# Patient Record
Sex: Male | Born: 1970
Health system: Southern US, Community
[De-identification: ages and names within clinical notes are randomized; demographics above are authoritative.]

## PROBLEM LIST (undated history)

## (undated) DIAGNOSIS — K5900 Constipation, unspecified: Secondary | ICD-10-CM

## (undated) DIAGNOSIS — K219 Gastro-esophageal reflux disease without esophagitis: Secondary | ICD-10-CM

## (undated) DIAGNOSIS — E119 Type 2 diabetes mellitus without complications: Secondary | ICD-10-CM

## (undated) DIAGNOSIS — K449 Diaphragmatic hernia without obstruction or gangrene: Secondary | ICD-10-CM

## (undated) HISTORY — PX: NO PAST SURGERIES: SHX2092

## (undated) HISTORY — DX: Diaphragmatic hernia without obstruction or gangrene: K44.9

## (undated) HISTORY — DX: Gastro-esophageal reflux disease without esophagitis: K21.9

## (undated) HISTORY — DX: Constipation, unspecified: K59.00

---

## 2016-03-07 ENCOUNTER — Ambulatory Visit: Payer: Self-pay | Admitting: Family Medicine

## 2016-10-19 DIAGNOSIS — J42 Unspecified chronic bronchitis: Secondary | ICD-10-CM | POA: Insufficient documentation

## 2016-10-19 DIAGNOSIS — E349 Endocrine disorder, unspecified: Secondary | ICD-10-CM | POA: Insufficient documentation

## 2016-11-29 ENCOUNTER — Emergency Department
Admission: EM | Admit: 2016-11-29 | Discharge: 2016-11-29 | Disposition: A | Discharge status: Home / Self Care | Payer: Self-pay | Attending: Student in an Organized Health Care Education/Training Program | Admitting: Student in an Organized Health Care Education/Training Program

## 2016-11-29 ENCOUNTER — Encounter: Payer: Self-pay | Admitting: Medical Oncology

## 2016-11-29 ENCOUNTER — Emergency Department: Payer: Self-pay

## 2016-11-29 DIAGNOSIS — K611 Rectal abscess: Secondary | ICD-10-CM | POA: Insufficient documentation

## 2016-11-29 DIAGNOSIS — E119 Type 2 diabetes mellitus without complications: Secondary | ICD-10-CM | POA: Insufficient documentation

## 2016-11-29 DIAGNOSIS — K610 Anal abscess: Secondary | ICD-10-CM | POA: Insufficient documentation

## 2016-11-29 HISTORY — DX: Type 2 diabetes mellitus without complications: E11.9

## 2016-11-29 LAB — CBC
HCT: 43.1 % (ref 40.0–52.0)
HEMOGLOBIN: 14.8 g/dL (ref 13.0–18.0)
MCH: 30.7 pg (ref 26.0–34.0)
MCHC: 34.4 g/dL (ref 32.0–36.0)
MCV: 89.4 fL (ref 80.0–100.0)
Platelets: 159 10*3/uL (ref 150–440)
RBC: 4.82 MIL/uL (ref 4.40–5.90)
RDW: 12.9 % (ref 11.5–14.5)
WBC: 14.3 10*3/uL — AB (ref 3.8–10.6)

## 2016-11-29 LAB — COMPREHENSIVE METABOLIC PANEL
ALK PHOS: 68 U/L (ref 38–126)
ALT: 27 U/L (ref 17–63)
AST: 20 U/L (ref 15–41)
Albumin: 4.1 g/dL (ref 3.5–5.0)
Anion gap: 7 (ref 5–15)
BUN: 14 mg/dL (ref 6–20)
CALCIUM: 8.9 mg/dL (ref 8.9–10.3)
CHLORIDE: 100 mmol/L — AB (ref 101–111)
CO2: 27 mmol/L (ref 22–32)
CREATININE: 0.85 mg/dL (ref 0.61–1.24)
GFR calc non Af Amer: >60 mL/min (ref >60)
Glucose, Bld: 128 mg/dL — ABNORMAL HIGH (ref 65–99)
Potassium: 3.9 mmol/L (ref 3.5–5.1)
SODIUM: 134 mmol/L — AB (ref 135–145)
Total Bilirubin: 0.7 mg/dL (ref 0.3–1.2)
Total Protein: 7.6 g/dL (ref 6.5–8.1)

## 2016-11-29 MED ORDER — AMOXICILLIN-POT CLAVULANATE 875-125 MG PO TABS: 1.0000 | ORAL_TABLET | Freq: Two times a day (BID) | ORAL | 0 refills | Status: AC

## 2016-11-29 MED ORDER — LIDOCAINE HCL (PF) 1 % IJ SOLN
INTRAMUSCULAR | Status: AC
  Filled 2016-11-29: qty 10

## 2016-11-29 MED ORDER — OXYCODONE-ACETAMINOPHEN 5-325 MG PO TABS
1.0000 | ORAL_TABLET | Freq: Once | ORAL | Status: AC
  Administered 2016-11-29: 1 via ORAL
  Filled 2016-11-29: qty 1

## 2016-11-29 MED ORDER — IOPAMIDOL (ISOVUE-300) INJECTION 61%
100.0000 mL | Freq: Once | INTRAVENOUS | Status: AC | PRN
  Administered 2016-11-29: 100 mL via INTRAVENOUS
  Filled 2016-11-29: qty 100

## 2016-11-29 NOTE — ED Provider Notes (Signed)
[facility] Emergency Department Provider Note  ____________________________________________  Time seen: Approximately 4:48 PM  I have reviewed the triage vital signs and the nursing notes.   HISTORY  Chief Complaint Abscess    HPI [facility]. is a 45 y.o. male that presents to emergency department with an abscess on left buttocks for 3 days. Patient denies any drainage. Patient has been unable to sit today. Patient has not taken anything for pain today. Patient states that he has been having chills the last 2 days but he is unsure of fever. Patient states that he has had several abscesses in the past. Patient usually gets them on his stomach. Patient had abscess on left buttocks about one month ago but it spontaneously popped drained and healed.   Past Medical History:  Diagnosis Date  . Diabetes mellitus without complication ([place])     There are no active problems to display for this patient.   No past surgical history on file.  Prior to Admission medications   Medication Sig Start Date End Date Taking? Authorizing Provider  amoxicillin-clavulanate (AUGMENTIN) 875-125 MG tablet Take 1 tablet by mouth 2 (two) times daily. 11/29/16 12/09/16  Laban Emperor, PA-C    Allergies Patient has no known allergies.  No family history on file.  Social History Social History  Substance Use Topics  . Smoking status: Not on file  . Smokeless tobacco: Not on file  . Alcohol use Not on file     Review of Systems  ENT: No upper respiratory complaints. Cardiovascular: No chest pain. Respiratory: No cough. No SOB. Gastrointestinal: No abdominal pain.  No nausea, no vomiting.    ____________________________________________   PHYSICAL EXAM:  VITAL SIGNS: ED Triage Vitals  Enc Vitals Group     BP 11/29/16 1444 136/84     Pulse Rate 11/29/16 1444 (!) 102     Resp 11/29/16 1444 16     Temp 11/29/16 1444 98.2 F (36.8 C)     Temp Source 11/29/16  1444 Oral     SpO2 11/29/16 1444 97 %     Weight 11/29/16 1441 224 lb (101.6 kg)     Height 11/29/16 1441 5\' 9"  (1.753 m)     Head Circumference --      Peak Flow --      Pain Score 11/29/16 1441 4     Pain Loc --      Pain Edu? --      Excl. in [place]? --      Constitutional: Alert and oriented. Well appearing and in no acute distress. Eyes: Conjunctivae are normal. PERRL. EOMI. Head: Atraumatic. ENT:      Ears:      Nose: No congestion/rhinnorhea.      Mouth/Throat: Mucous membranes are moist.  Neck: No stridor.   Cardiovascular: Normal rate, regular rhythm. Normal S1 and S2.  Good peripheral circulation. Respiratory: Normal respiratory effort without tachypnea or retractions. Lungs CTAB. Good air entry to the bases with no decreased or absent breath sounds. Gastrointestinal: Bowel sounds 4 quadrants. Soft and nontender to palpation. No guarding or rigidity. No palpable masses. No distention.  Genitourinary: 4 cm fluctuant abscess on left buttock's near rectum. Redness extending around abscess. Musculoskeletal: Full range of motion to all extremities. No gross deformities appreciated. Neurologic:  Normal speech and language. No gross focal neurologic deficits are appreciated.  Skin:  Skin is warm, dry and intact. No rash noted. Psychiatric: Mood and affect are normal. Speech and behavior are  normal. Patient exhibits appropriate insight and judgement.   ____________________________________________   LABS (all labs ordered are listed, but only abnormal results are displayed)  Labs Reviewed  CBC - Abnormal; Notable for the following:       Result Value   WBC 14.3 (*)    All other components within normal limits  COMPREHENSIVE METABOLIC PANEL - Abnormal; Notable for the following:    Sodium 134 (*)    Chloride 100 (*)    Glucose, Bld 128 (*)    All other components within normal limits    ____________________________________________  EKG   ____________________________________________  RADIOLOGY  Ct Pelvis W Contrast  Result Date: 11/29/2016 CLINICAL DATA:  45 year old diabetic male with abscess near rectum for 3 days. Patient reports frequent abscesses. Denies fever. EXAM: CT PELVIS WITH CONTRAST TECHNIQUE: Multidetector CT imaging of the pelvis was performed using the standard protocol following the bolus administration of intravenous contrast. CONTRAST:  153mL ISOVUE-300 IOPAMIDOL (ISOVUE-300) INJECTION 61% COMPARISON:  None. FINDINGS: Adjacent to the inferior aspect of the rectum slightly to left of midline is a 4.7 x 4 x 2 cm abscess. There is mild inflammation of adjacent fat planes of the buttock region greater on the left which may indicate cellulitis/deep dependent edema. No associated gas collection as can be seen with necrotizing fasciitis. No extension into the pelvis. Mild infiltration of fat planes lower right anterior abdominal wall may be related to insulin injection although focal cellulitis at this level not excluded. Calcified plaque proximal right common iliac artery. Minimally lobulated prostate gland. Tiny amount of fluid within the scrotum. IMPRESSION: Adjacent to the inferior aspect of the rectum slightly to left of midline is a 4.7 x 4 x 2 cm abscess. There is mild inflammation of adjacent fat planes of the buttock region greater on the left which may indicate cellulitis/deep dependent edema. No associated gas collection as can be seen with necrotizing fasciitis. No extension into the pelvis. Mild infiltration of fat planes lower right anterior abdominal wall may be related to insulin injection although focal cellulitis at this level not excluded. Electronically Signed   By: Genia Del M.D.   On: 11/29/2016 17:18    ____________________________________________    PROCEDURES  Procedure(s) performed:    Procedures  INCISION AND  DRAINAGE Performed by: Laban Emperor Consent: Verbal consent obtained. Risks and benefits: risks, benefits and alternatives were discussed Type: abscess  Body area: left buttocks  Anesthesia: local infiltration  Incision was made with a scalpel.  Local anesthetic: lidocaine 1% without epinephrine  Anesthetic total: 4 ml  Complexity: complex Blunt dissection to break up loculations  Drainage: purulent  Drainage amount: 5ccs  Packing material: 1/4 in iodoform gauze  Patient tolerance: Patient tolerated the procedure well with no immediate complications.     Medications  lidocaine (PF) (XYLOCAINE) 1 % injection (not administered)  oxyCODONE-acetaminophen (PERCOCET/ROXICET) 5-325 MG per tablet 1 tablet (1 tablet Oral Given 11/29/16 1652)  iopamidol (ISOVUE-300) 61 % injection 100 mL (100 mLs Intravenous Contrast Given 11/29/16 1658)     ____________________________________________   INITIAL IMPRESSION / ASSESSMENT AND PLAN / ED COURSE  Pertinent labs & imaging results that were available during my care of the patient were reviewed by me and considered in my medical decision making (see chart for details).  Review of the  [place]RS was performed in accordance of the [place] prior to dispensing any controlled drugs.  Clinical Course     Patient's diagnosis is consistent with perianal abscess. Abscess was I&D  in the emergency department. Abscess does not extend into the rectum. The signs and exam are reassuring. Patient will be discharged home with prescriptions for Augmentin. Patient is to follow up with PCP in 2 days for wound recheck and packing removal. Patient is given ED precautions to return to the ED for any worsening or new symptoms.  ____________________________________________  FINAL CLINICAL IMPRESSION(S) / ED DIAGNOSES  Final diagnoses:  Perirectal abscess  Perianal abscess      NEW MEDICATIONS STARTED DURING THIS VISIT:  Discharge Medication List as  of 11/29/2016  6:34 PM    START taking these medications   Details  amoxicillin-clavulanate (AUGMENTIN) 875-125 MG tablet Take 1 tablet by mouth 2 (two) times daily., Starting Tue 11/29/2016, Until Fri 12/09/2016, Print            This chart was dictated using voice recognition software/Dragon. Despite best efforts to proofread, errors can occur which can change the meaning. Any change was purely unintentional.    Laban Emperor, PA-C 11/29/16 2136    Merlyn Lot, MD 11/29/16 2211

## 2016-11-29 NOTE — ED Triage Notes (Signed)
Abscess between buttocks x 3 days.

## 2016-11-29 NOTE — ED Notes (Signed)
Pt has an abscess on his rectum, pt reports have frequent abscesses,pt denies fever

## 2017-01-09 DIAGNOSIS — E119 Type 2 diabetes mellitus without complications: Secondary | ICD-10-CM | POA: Diagnosis not present

## 2017-01-09 DIAGNOSIS — E349 Endocrine disorder, unspecified: Secondary | ICD-10-CM | POA: Diagnosis not present

## 2017-01-16 DIAGNOSIS — E349 Endocrine disorder, unspecified: Secondary | ICD-10-CM | POA: Diagnosis not present

## 2017-01-16 DIAGNOSIS — J42 Unspecified chronic bronchitis: Secondary | ICD-10-CM | POA: Diagnosis not present

## 2017-01-16 DIAGNOSIS — E119 Type 2 diabetes mellitus without complications: Secondary | ICD-10-CM | POA: Diagnosis not present

## 2017-01-16 DIAGNOSIS — E7800 Pure hypercholesterolemia, unspecified: Secondary | ICD-10-CM | POA: Diagnosis not present

## 2017-01-30 DIAGNOSIS — R739 Hyperglycemia, unspecified: Secondary | ICD-10-CM | POA: Diagnosis not present

## 2017-01-30 DIAGNOSIS — R3911 Hesitancy of micturition: Secondary | ICD-10-CM | POA: Diagnosis not present

## 2017-05-04 DIAGNOSIS — E7800 Pure hypercholesterolemia, unspecified: Secondary | ICD-10-CM | POA: Diagnosis not present

## 2017-05-04 DIAGNOSIS — E119 Type 2 diabetes mellitus without complications: Secondary | ICD-10-CM | POA: Diagnosis not present

## 2017-05-04 DIAGNOSIS — J42 Unspecified chronic bronchitis: Secondary | ICD-10-CM | POA: Diagnosis not present

## 2017-05-04 LAB — HEMOGLOBIN A1C: Hemoglobin A1C: 8.3

## 2017-05-04 LAB — LIPID PANEL
Cholesterol: 154 (ref 0–200)
HDL: 29 — AB (ref 35–70)
LDL Cholesterol: 69
TRIGLYCERIDES: 282 — AB (ref 40–160)

## 2017-05-15 DIAGNOSIS — E119 Type 2 diabetes mellitus without complications: Secondary | ICD-10-CM | POA: Diagnosis not present

## 2017-05-15 DIAGNOSIS — K210 Gastro-esophageal reflux disease with esophagitis: Secondary | ICD-10-CM | POA: Diagnosis not present

## 2017-05-15 DIAGNOSIS — J42 Unspecified chronic bronchitis: Secondary | ICD-10-CM | POA: Diagnosis not present

## 2017-05-15 DIAGNOSIS — E7800 Pure hypercholesterolemia, unspecified: Secondary | ICD-10-CM | POA: Diagnosis not present

## 2017-08-30 ENCOUNTER — Encounter: Payer: Self-pay | Admitting: Family Medicine

## 2017-08-30 ENCOUNTER — Ambulatory Visit (INDEPENDENT_AMBULATORY_CARE_PROVIDER_SITE_OTHER): Payer: BLUE CROSS/BLUE SHIELD | Admitting: Family Medicine

## 2017-08-30 VITALS — BP 110/84 | HR 88 | Temp 98.2°F | Resp 16 | Ht 69.0 in | Wt 211.0 lb

## 2017-08-30 DIAGNOSIS — K449 Diaphragmatic hernia without obstruction or gangrene: Secondary | ICD-10-CM | POA: Insufficient documentation

## 2017-08-30 DIAGNOSIS — F329 Major depressive disorder, single episode, unspecified: Secondary | ICD-10-CM | POA: Insufficient documentation

## 2017-08-30 DIAGNOSIS — R34 Anuria and oliguria: Secondary | ICD-10-CM | POA: Diagnosis not present

## 2017-08-30 DIAGNOSIS — F331 Major depressive disorder, recurrent, moderate: Secondary | ICD-10-CM

## 2017-08-30 DIAGNOSIS — F32A Depression, unspecified: Secondary | ICD-10-CM | POA: Insufficient documentation

## 2017-08-30 DIAGNOSIS — Z7689 Persons encountering health services in other specified circumstances: Secondary | ICD-10-CM | POA: Diagnosis not present

## 2017-08-30 DIAGNOSIS — K219 Gastro-esophageal reflux disease without esophagitis: Secondary | ICD-10-CM | POA: Diagnosis not present

## 2017-08-30 DIAGNOSIS — G5603 Carpal tunnel syndrome, bilateral upper limbs: Secondary | ICD-10-CM

## 2017-08-30 DIAGNOSIS — E119 Type 2 diabetes mellitus without complications: Secondary | ICD-10-CM | POA: Diagnosis not present

## 2017-08-30 DIAGNOSIS — K5900 Constipation, unspecified: Secondary | ICD-10-CM | POA: Diagnosis not present

## 2017-08-30 MED ORDER — CITALOPRAM HYDROBROMIDE 20 MG PO TABS: 20.0000 mg | ORAL_TABLET | Freq: Every day | ORAL | 3 refills | Status: DC

## 2017-08-30 MED ORDER — DOCUSATE SODIUM 100 MG PO CAPS: 100.0000 mg | ORAL_CAPSULE | Freq: Two times a day (BID) | ORAL | 3 refills | Status: DC

## 2017-08-30 MED ORDER — POLYETHYLENE GLYCOL 3350 17 GM/SCOOP PO POWD: 17.0000 g | Freq: Two times a day (BID) | ORAL | 3 refills | Status: AC

## 2017-08-30 NOTE — Assessment & Plan Note (Signed)
Patient reports this was found on previous EGD and was told no treatment was necessary Likely contributing to GERD symptoms

## 2017-08-30 NOTE — Assessment & Plan Note (Signed)
Advised to wear cockup wrist splints bilaterally when sleeping daily at bedtime If symptoms continue, could try gabapentin Could also consider referral to orthopedics for injections

## 2017-08-30 NOTE — Assessment & Plan Note (Signed)
Patient needs a follow-up appointment for diabetes follow-up with foot exam, eye exam, vaccination updates, A1c

## 2017-08-30 NOTE — Assessment & Plan Note (Signed)
Patient reports decreased urinary frequency and some hesitancy He also has concerns about possible low testosterone Referral to urology for further management

## 2017-08-30 NOTE — Assessment & Plan Note (Signed)
Moderate depression with symptoms like anhedonia fatigue No SI Patient agrees to start SSRI Start Celexa 20 mg daily Discussed potential side effects including GI upset, sexual dysfunction, increased suicidality Contracted for safety Discussed that SSRIs take 6-8 weeks to reach maximum efficacy Follow-up in one month and consider dose titration

## 2017-08-30 NOTE — Progress Notes (Signed)
Patient: [name]. Male    DOB: [birth year]   46 y.o.   MRN: [id number] Visit Date: 08/30/2017  Today's Provider: Lavon Paganini, MD   Chief Complaint  Patient presents with  . Establish Care   Subjective:    HPI   Establish Care Ethan Fox presents to establish care. He was previously seeing [facility] as his primary, but his wife wanted him to change PCP's. He has a H/O GERD and elevated triglycerides. Last lipid panel was checked in May by [facility] He has DM; last A1C was 8.3% on 05/04/2017, and urine microalbumin was 11 at that time.   Reports circulation problems: States that arms seem to fall asleep and loses feeling in thumbs often.  Can happen out of the blue, often when laying down.  Worse when driving.  GF also mentions his color changes while he is sleeping. He turns "white".  Decreased urination: doesn't feel like he completely empties bladder, urine seems dark in color.  Drinking a lot of water and powerade. Only urinates 2-3 times daily.  Also reports chronic testicular pain that he thinks is due to old age.  Constipation:  Ongoing problem for several (10-15) years.  Seems to be worsening.  Was previously taking fiber supplement that was helping but stopped after starting statin.  Was previously taking lactulose and uses laxative (ex-lax) prn.  Can go up to 14 days without BM.  Often q4-5 days for BM.  Difficult to use bathroom.  Stays "gassy." DEcreased appetite due to bloating and fullness.  Previously seen by GI and received EGD and colonoscopy.    Doesn't think there were abnormalities (was seen by Dr Oletta Lamas in [place]).  Tried Miralax daily and didn't think it helped.  Lifestyle is changing due to only driving locally.    Decreased energy: very irritable.  Thinks he has low T and used to get testosterone shots at a young age.  Taking mushroom pills for energy supplement.  +depressed mood, but he doesn't think this is bad.  Quick to anger.  +inhadonia    No Known Allergies   Current Outpatient Prescriptions:  .  metFORMIN (GLUCOPHAGE-XR) 500 MG 24 hr tablet, Take 1 tablet by mouth 2 (two) times daily., Disp: , Rfl:  .  omeprazole (PRILOSEC) 20 MG capsule, Take 20 mg by mouth daily., Disp: , Rfl:  .  OVER THE COUNTER MEDICATION, Mushrooms Cordyceps Energy Support, Disp: , Rfl:  .  rosuvastatin (CRESTOR) 40 MG tablet, Take 1 tablet by mouth daily., Disp: , Rfl:   Review of Systems  Constitutional: Positive for activity change, appetite change, diaphoresis and fatigue.  HENT: Positive for congestion and facial swelling.   Eyes: Positive for photophobia, redness, itching and visual disturbance.  Respiratory: Positive for cough, choking, chest tightness, shortness of breath and stridor.   Cardiovascular: Positive for chest pain and palpitations.  Gastrointestinal: Positive for abdominal distention, constipation, nausea and vomiting.  Endocrine: Positive for cold intolerance and polydipsia.  Genitourinary: Positive for decreased urine volume, difficulty urinating and genital sores.  Musculoskeletal: Positive for back pain, myalgias and neck pain.  Skin: Positive for color change and pallor.  Neurological: Positive for dizziness, light-headedness, numbness and headaches.  Hematological: Bruises/bleeds easily.  Psychiatric/Behavioral: Positive for agitation, confusion and decreased concentration.  All other systems reviewed and are negative.  Past Medical History:  Diagnosis Date  . Constipation   . Diabetes mellitus without complication (Ethan Fox)   . GERD (gastroesophageal reflux disease)   .  Hiatal hernia    Past Surgical History:  Procedure Laterality Date  . NO PAST SURGERIES     Family History  Problem Relation Age of Onset  . COPD Mother 40       smoker  . Kidney disease Mother   . Diabetes Mother   . Pancreatic cancer Mother   . Diabetes Father   . Hypothyroidism Sister   . Heart disease Maternal Grandmother     . Heart disease Maternal Grandfather   . Heart disease Paternal Grandmother 77       died of MI  . Colon cancer Neg Hx   . Breast cancer Neg Hx   . Prostate cancer Neg Hx     Social History  Substance Use Topics  . Smoking status: Not on file  . Smokeless tobacco: Not on file  . Alcohol use Not on file   Objective:   BP 110/84 (BP Location: Right Arm, Patient Position: Sitting, Cuff Size: Large)   Pulse 88   Temp 98.2 F (36.8 C) (Oral)   Resp 16   Ht 5\' 9"  (1.753 m)   Wt 211 lb (95.7 kg)   BMI 31.16 kg/m  Vitals:   08/30/17 1525  BP: 110/84  Pulse: 88  Resp: 16  Temp: 98.2 F (36.8 C)  TempSrc: Oral  Weight: 211 lb (95.7 kg)  Height: 5\' 9"  (1.753 m)     Physical Exam  Constitutional: He is oriented to person, place, and time. He appears well-developed and well-nourished. No distress.  HENT:  Head: Normocephalic and atraumatic.  Right Ear: External ear normal.  Left Ear: External ear normal.  Mouth/Throat: Oropharynx is clear and moist.  Eyes: Pupils are equal, round, and reactive to light. Conjunctivae are normal. No scleral icterus.  Neck: Neck supple. No thyromegaly present.  Cardiovascular: Normal rate, regular rhythm, normal heart sounds and intact distal pulses.   No murmur heard. Pulmonary/Chest: Effort normal and breath sounds normal. No respiratory distress. He has no wheezes. He has no rales.  Abdominal: Soft. Bowel sounds are normal. He exhibits no distension. There is no tenderness. There is no rebound and no guarding.  Musculoskeletal: He exhibits no edema or deformity.  Negative Tinel's, but positive Phalen's testing bilaterally  Lymphadenopathy:    He has no cervical adenopathy.  Neurological: He is alert and oriented to person, place, and time.  Skin: Skin is warm and dry. No rash noted.  Psychiatric: He has a normal mood and affect. His behavior is normal.  Vitals reviewed.   Depression screen PHQ 2/9 08/30/2017  Decreased Interest 3   Down, Depressed, Hopeless 3  PHQ - 2 Score 6  Altered sleeping 3  Tired, decreased energy 3  Change in appetite 1  Feeling bad or failure about yourself  0  Trouble concentrating 0  Moving slowly or fidgety/restless 0  Suicidal thoughts 0  PHQ-9 Score 13  Difficult doing work/chores Not difficult at all       Assessment & Plan:      Problem List Items Addressed This Visit      Respiratory   Hiatal hernia    Patient reports this was found on previous EGD and was told no treatment was necessary Likely contributing to GERD symptoms        Digestive   GERD (gastroesophageal reflux disease)    Continue PPI We will obtain records from previous EGD May want to consider tapering PPI in the future given potential consequences of long-term PPI use  Relevant Medications   omeprazole (PRILOSEC) 20 MG capsule   polyethylene glycol powder (GLYCOLAX/MIRALAX) powder   docusate sodium (COLACE) 100 MG capsule   Constipation    Significant 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, eating poorly, lack of exercise Likely that: Is dilated from chronic constipation and thus has not rebounded quickly with change of lifestyle We will start Mira lax and Colace twice daily Follow-up in one month and consider titration We will attempt to obtain records from previous colonoscopy        Endocrine   Diabetes mellitus without complication [facility])    Patient needs a follow-up appointment for diabetes follow-up with foot exam, eye exam, vaccination updates, A1c      Relevant Medications   rosuvastatin (CRESTOR) 40 MG tablet   metFORMIN (GLUCOPHAGE-XR) 500 MG 24 hr tablet   aspirin 81 MG chewable tablet     Nervous and Auditory   Bilateral carpal tunnel syndrome    Advised to wear cockup wrist splints bilaterally when sleeping daily at bedtime If symptoms continue, could try gabapentin Could also consider referral to orthopedics for injections      Relevant  Medications   citalopram (CELEXA) 20 MG tablet     Other   Depression    Moderate depression with symptoms like anhedonia fatigue No SI Patient agrees to start SSRI Start Celexa 20 mg daily Discussed potential side effects including GI upset, sexual dysfunction, increased suicidality Contracted for safety Discussed that SSRIs take 6-8 weeks to reach maximum efficacy Follow-up in one month and consider dose titration      Relevant Medications   citalopram (CELEXA) 20 MG tablet   Decreased urination    Patient reports decreased urinary frequency and some hesitancy He also has concerns about possible low testosterone Referral to urology for further management      Relevant Orders   Ambulatory referral to Urology    Other Visit Diagnoses    Encounter to establish care    -  Primary          The entirety of the information documented in the History of Present Illness, Review of Systems and Physical Exam were personally obtained by me. Portions of this information were initially documented by Raquel Sarna Ratchford, CMA and reviewed by me for thoroughness and accuracy.     Lavon Paganini, MD  [facility]

## 2017-08-30 NOTE — Assessment & Plan Note (Signed)
Continue PPI We will obtain records from previous EGD May want to consider tapering PPI in the future given potential consequences of long-term PPI use

## 2017-08-30 NOTE — Patient Instructions (Signed)
Switch to daily baby aspirin (81mg ).  Start celexa for depression symptoms (decreased energy, etc).  Try miralax and colace twice daily for constipation.  I will refer to urology for urinary complaints. You can also discuss concerns regarding low-T with them.   Fiber Content in Foods See the following list for the dietary fiber content of some common foods. High-fiber foods High-fiber foods contain 4 grams or more (4g or more) of fiber per serving. They include:  Artichoke (fresh) - 1 medium has 10.3g of fiber.  Baked beans, plain or vegetarian (canned) -  cup has 5.2g of fiber.  Blackberries or raspberries (fresh) -  cup has 4g of fiber.  Bran cereal -  cup has 8.6g of fiber.  Bulgur (cooked) -  cup has 4g of fiber.  Kidney beans (canned) -  cup has 6.8g of fiber.  Lentils (cooked) -  cup has 7.8g of fiber.  Pear (fresh) - 1 medium has 5.1g of fiber.  Peas (frozen) -  cup has 4.4g of fiber.  Pinto beans (canned) -  cup has 5.5g of fiber.  Pinto beans (dried and cooked) -  cup has 7.7g of fiber.  Potato with skin (baked) - 1 medium has 4.4g of fiber.  Quinoa (cooked) -  cup has 5g of fiber.  Soybeans (canned, frozen, or fresh) -  cup has 5.1g of fiber.  Moderate-fiber foods Moderate-fiber foods contain 1-4 grams (1-4g) of fiber per serving. They include:  Almonds - 1 oz. has 3.5g of fiber.  Apple with skin - 1 medium has 3.3g of fiber.  Applesauce, sweetened -  cup has 1.5g of fiber.  Bagel, plain - one 4-inch (10-cm) bagel has 2g of fiber.  Banana - 1 medium has 3.1g of fiber.  Broccoli (cooked) -  cup has 2.5g of fiber.  Carrots (cooked) -  cup has 2.3g of fiber.  Corn (canned or frozen) -  cup has 2.1g of fiber.  Corn tortilla - one 6-inch (15-cm) tortilla has 1.5g of fiber.  Green beans (canned) -  cup has 2g of fiber.  Instant oatmeal -  cup has about 2g of fiber.  Long-grain brown rice (cooked) - 1 cup has 3.5g of fiber.  Macaroni,  enriched (cooked) - 1 cup has 2.5g of fiber.  Melon - 1 cup has 1.4g of fiber.  Multigrain cereal -  cup has about 2-4g of fiber.  Orange - 1 small has 3.1g of fiber.  Potatoes, mashed -  cup has 1.6g of fiber.  Raisins - 1/4 cup has 1.6g of fiber.  Squash -  cup has 2.9g of fiber.  Sunflower seeds -  cup has 1.1g of fiber.  Tomato - 1 medium has 1.5g of fiber.  Vegetable or soy patty - 1 has 3.4g of fiber.  Whole-wheat bread - 1 slice has 2g of fiber.  Whole-wheat spaghetti -  cup has 3.2g of fiber.  Low-fiber foods Low-fiber foods contain less than 1 gram (less than 1g) of fiber per serving. They include:  Egg - 1 large.  Flour tortilla - one 6-inch (15-cm) tortilla.  Fruit juice -  cup.  Lettuce - 1 cup.  Meat, poultry, or fish - 1 oz.  Milk - 1 cup.  Spinach (raw) - 1 cup.  White bread - 1 slice.  White rice -  cup.  Yogurt -  cup.  Actual amounts of fiber in foods may be different depending on processing. Talk with your dietitian about how much fiber you need  in your diet. This information is not intended to replace advice given to you by your health care provider. Make sure you discuss any questions you have with your health care provider. Document Released: 04/09/2007 Document Revised: 04/28/2016 Document Reviewed: 01/14/2016 Elsevier Interactive Patient Education  2018 [employer].

## 2017-08-30 NOTE — Assessment & Plan Note (Signed)
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

## 2017-09-11 NOTE — Progress Notes (Signed)
09/12/2017 12:01 PM   [employer]. [birth year] [id number]  Referring provider: Virginia Crews, MD [address] [place], [place] [address]  Chief Complaint  Patient presents with  . New Patient (Initial Visit)    Decreased Urination / Left Testicle pain referred by Dr. Geri Seminole    HPI: Patient is a 46 year old Caucasian male with DM who is referred by Dr. Geri Seminole for decreased urination and a history of testosterone deficiency with his girlfriend, Sharyn Lull.    BPH WITH LUTS  (prostate and/or bladder) His IPSS score today is 17, which is moderate lower urinary tract symptomatology.  He is unhappy with his quality life due to his urinary symptoms.  His PVR is 251 mL.    His major complaint today is a weak urinary stream.  He has had these symptoms for two years.  He denies any dysuria, hematuria or suprapubic pain.   He also denies any recent fevers, chills, nausea or vomiting.  He does not have a family history of PCa.      IPSS    Row Name 09/12/17 1100         International Prostate Symptom Score   How often have you had the sensation of not emptying your bladder? About half the time     How often have you had to urinate less than every two hours? Less than 1 in 5 times     How often have you found you stopped and started again several times when you urinated? About half the time     How often have you found it difficult to postpone urination? More than half the time     How often have you had a weak urinary stream? Almost always     How often have you had to strain to start urination? Not at All     How many times did you typically get up at night to urinate? 1 Time     Total IPSS Score 17       Quality of Life due to urinary symptoms   If you were to spend the rest of your life with your urinary condition just the way it is now how would you feel about that? Unhappy        Score:  1-7 Mild 8-19 Moderate 20-35  Severe   Testosterone deficiency Patient is experiencing a decrease in libido, a lack of energy, a decrease in strength, a decreased enjoyment in life, sadness and/or grumpiness, erections being less strong and a recent deterioration in an ability to play sports.  This is indicated by his responses to the ADAM questionnaire.  He is still having spontaneous erections at night.  Girlfriend states that she feels she has witnessed apneic episodes while he was sleeping.  He has not had a formal sleep study.   He is reporting a loss of body hair, reduced beard growth, obesity, poor memory, type 2 diabetes mellitus.  He has been on testosterone cypionate in the past.       Androgen Deficiency in the Aging Male    [place] Name 09/12/17 1100         Androgen Deficiency in the Aging Male   Do you have a decrease in libido (sex drive) Yes     Do you have lack of energy Yes     Do you have a decrease in strength and/or endurance Yes     Have you lost height No  Have you noticed a decreased "enjoyment of life" Yes     Are you sad and/or grumpy Yes     Are your erections less strong Yes     Have you noticed a recent deterioration in your ability to play sports Yes     Are you falling asleep after dinner No     Has there been a recent deterioration in your work performance No       Erectile dysfunction His SHIM score is 19, which is mild ED.   He has been having difficulty with erections for a few years.   His major complaint is not having erections when he is wanting to have sex.  His libido is diminished.   His risk factors for ED are age, BPH, DM, HLD, sleep apnea, night shift work and smoking.   He denies any painful erections or curvatures with his erections.   He is still having spontaneous erections.       SHIM    Row Name 09/12/17 1124         SHIM: Over the last 6 months:   How do you rate your confidence that you could get and keep an erection? Moderate     When you had erections with  sexual stimulation, how often were your erections hard enough for penetration (entering your partner)? Sometimes (about half the time)     During sexual intercourse, how often were you able to maintain your erection after you had penetrated (entered) your partner? Most Times (much more than half the time)     During sexual intercourse, how difficult was it to maintain your erection to completion of intercourse? Not Difficult     When you attempted sexual intercourse, how often was it satisfactory for you? Most Times (much more than half the time)       SHIM Total Score   SHIM 19        Score: 1-7 Severe ED 8-11 Moderate ED 12-16 Mild-Moderate ED 17-21 Mild ED 22-25 No ED  Reviewed referral notes -  Patient reports decreased urinary frequency and some hesitancy He also has concerns about possible low testosterone Referral to urology for further management  PMH: Past Medical History:  Diagnosis Date  . Constipation   . Diabetes mellitus without complication ([place])   . GERD (gastroesophageal reflux disease)   . Hiatal hernia     Surgical History: Past Surgical History:  Procedure Laterality Date  . NO PAST SURGERIES      Home Medications:  Allergies as of 09/12/2017   No Known Allergies     Medication List       Accurate as of 09/12/17 12:01 PM. Always use your most recent med list.          aspirin 81 MG chewable tablet Chew 81 mg by mouth daily.   citalopram 20 MG tablet Commonly known as:  CELEXA Take 1 tablet (20 mg total) by mouth daily.   docusate sodium 100 MG capsule Commonly known as:  COLACE Take 1 capsule (100 mg total) by mouth 2 (two) times daily.   metFORMIN 500 MG 24 hr tablet Commonly known as:  GLUCOPHAGE-XR Take 1 tablet by mouth 2 (two) times daily.   omeprazole 20 MG capsule Commonly known as:  PRILOSEC Take 20 mg by mouth daily.   OVER THE COUNTER MEDICATION Mushrooms Cordyceps Energy Support   polyethylene glycol powder  powder Commonly known as:  GLYCOLAX/MIRALAX Take 17 g by mouth 2 (two) times  daily.   rosuvastatin 40 MG tablet Commonly known as:  CRESTOR Take 1 tablet by mouth daily.   tamsulosin 0.4 MG Caps capsule Commonly known as:  FLOMAX Take 1 capsule (0.4 mg total) by mouth daily.       Allergies: No Known Allergies  Family History: Family History  Problem Relation Age of Onset  . COPD Mother 53       smoker  . Kidney disease Mother   . Diabetes Mother   . Pancreatic cancer Mother   . Diabetes Father   . Hypothyroidism Sister   . Heart disease Maternal Grandmother   . Heart disease Maternal Grandfather   . Heart disease Paternal Grandmother 18       died of MI  . Colon cancer Neg Hx   . Breast cancer Neg Hx   . Prostate cancer Neg Hx   . Kidney cancer Neg Hx   . Bladder Cancer Neg Hx     Social History:  reports that he has been smoking Cigarettes.  He has a 15.00 pack-year smoking history. He has never used smokeless tobacco. He reports that he drinks alcohol. He reports that he does not use drugs.  ROS: UROLOGY Frequent Urination?: No Hard to postpone urination?: No Burning/pain with urination?: No Get up at night to urinate?: No Leakage of urine?: No Urine stream starts and stops?: No Trouble starting stream?: No Do you have to strain to urinate?: No Blood in urine?: No Urinary tract infection?: No Sexually transmitted disease?: No Injury to kidneys or bladder?: No Painful intercourse?: No Weak stream?: Yes Erection problems?: Yes Penile pain?: No  Gastrointestinal Nausea?: No Vomiting?: No Indigestion/heartburn?: Yes Diarrhea?: No Constipation?: Yes  Constitutional Fever: No Night sweats?: Yes Weight loss?: No Fatigue?: Yes  Skin Skin rash/lesions?: Yes Itching?: No  Eyes Blurred vision?: Yes Double vision?: Yes  Ears/Nose/Throat Sore throat?: No Sinus problems?: No  Hematologic/Lymphatic Swollen glands?: No Easy bruising?:  Yes  Cardiovascular Leg swelling?: No Chest pain?: Yes  Respiratory Cough?: Yes Shortness of breath?: Yes  Endocrine Excessive thirst?: No  Musculoskeletal Back pain?: Yes Joint pain?: Yes  Neurological Headaches?: Yes Dizziness?: Yes  Psychologic Depression?: Yes Anxiety?: Yes  Physical Exam: BP 127/81   Pulse 86   Ht 5\' 10"  (1.778 m)   Wt 205 lb 6.4 oz (93.2 kg)   BMI 29.47 kg/m   Constitutional: Well nourished. Alert and oriented, No acute distress. HEENT: [place] AT, moist mucus membranes. Trachea midline, no masses. Cardiovascular: No clubbing, cyanosis, or edema. Respiratory: Normal respiratory effort, no increased work of breathing. GI: Abdomen is soft, non tender, non distended, no abdominal masses. Liver and spleen not palpable.  No hernias appreciated.  Stool sample for occult testing is not indicated.   GU: No CVA tenderness.  No bladder fullness or masses.  Patient with circumcised phallus.   Urethral meatus is patent.  No penile discharge. No penile lesions or rashes. Scrotum without lesions, cysts, rashes and/or edema.  Testicles are located scrotally bilaterally. No masses are appreciated in the testicles. Left and right epididymis are normal. Rectal: Patient with  normal sphincter tone. Anus and perineum without scarring or rashes. No rectal masses are appreciated. Prostate is approximately 45 grams, no nodules are appreciated. Seminal vesicles are normal. Skin: No rashes, bruises or suspicious lesions. Lymph: No cervical or inguinal adenopathy. Neurologic: Grossly intact, no focal deficits, moving all 4 extremities. Psychiatric: Normal mood and affect.  Laboratory Data: Lab Results  Component Value Date  WBC 14.3 (H) 11/29/2016   HGB 14.8 11/29/2016   HCT 43.1 11/29/2016   MCV 89.4 11/29/2016   PLT 159 11/29/2016    Lab Results  Component Value Date   CREATININE 0.85 11/29/2016    Lab Results  Component Value Date   AST 20 11/29/2016   Lab  Results  Component Value Date   ALT 27 11/29/2016    I have reviewed the labs.   Pertinent Imaging: Results for SEVIN, LANGENBACH (MRN [id number]) as of 09/12/2017 12:11  Ref. Range 09/12/2017 11:22  Scan Result Unknown [id number]   I have independently reviewed the films.    Assessment & Plan:    1. BPH with LUTS  - IPSS score is 17/5  - Continue conservative management, avoiding bladder irritants and timed voiding's  - most bothersome symptoms is/are weak stream  - Initiate alpha-blocker (tamsulosin 0.4 mg), discussed side effects   - RTC in one month for I PSS and PVR   2. History of testosterone deficiency  - I explained to patient that the current guidelines from the [place] reports the diagnosis of hypogonadism requires a morning serum total testosterone level at least 2 days apart that is below 300 ng/dL - most insurances require the blood work to be done before 9 am.    - At this time, the patient does not meet this requirement.  He will return for two morning serum testosterones, two days apart before 9 AM   - If the testosterones levels return below 300 ng/dL we will need to do additional blood work [facility] - if that returns below normal or low normal we will need to add a prolactin) - if that blood work is abnormal - we will need to refer to endocrinology  -I discussed with the patient the side effects of testosterone therapy, such as: enlargement of the prostate gland that may in turn cause LUTS, possible increased risk of PCa, DVT's and/or PE's, possible increased risk of heart attack or stroke, lower sperm count, swelling of the ankles, feet, or body, with or without heart failure, enlarged or painful breasts, have problems breathing while you sleep (sleep apnea), increased prostate specific antigen, mood swings, hypertension and increased red blood cell count - we will need to check HCT/HBG levels prior to therapy and PSA if the patient is over 40 - he needs a sleep study prior to  starting any exogenous testosterone therapy if he meets criteria  - I also discussed that some men have had success using clomid for hypogonadism.  It does seem to be more successful in younger men, but there are incidences of good results in middle-aged men.  I explained that it is used in male infertility to stimulate the testicles to make more testosterone/sperm.  There has been no long term data on side effects, but some urologists has been having success with this medication.   3. Erectile dysfunction  - SHIM score is 19  - I explained to the patient that in order to achieve an erection it takes good functioning of the nervous system (parasympathetic and rs, sympathetic, sensory and motor), good blood flow into the erectile tissue of the penis and a desire to have sex  - I explained that conditions like diabetes, hypertension, coronary artery disease, peripheral vascular disease, smoking, alcohol consumption, age, sleep apnea and BPH can diminish the ability to have an erection  - I explained the ED may be a risk marker for underlying CVD and he should follow  up with PCP for further studies - has an appointment in a few weaks  - we will obtain a serum testosterone level at this time; if it is abnormal we will need to repeat the study for confirmation  - A recent study published in Sex Med 2018 Apr 13 revealed moderate to vigorous aerobic exercise for 40 minutes 4 times per week can decrease erectile problems caused by physical inactivity, obesity, hypertension, metabolic syndrome and/or cardiovascular diseases  - RTC pending testosterone results  4. Diabetes  - I explained to the patient how uncontrolled diabetes can contribute to a dysfunctional bladder, constipation and erectile dysfunction  - Encouraged the patient to remove his PCP to get better control of his diabetes at this time  Return in about 1 month (around 10/13/2017) for Wednesday before 9 AM testosterone, IPSS and PVR.  These  notes generated with voice recognition software. I apologize for typographical errors.  Zara Council, [facility] [address], [place], [place] [address] [phone]

## 2017-09-12 ENCOUNTER — Encounter: Payer: Self-pay | Admitting: Urology

## 2017-09-12 ENCOUNTER — Ambulatory Visit: Payer: BLUE CROSS/BLUE SHIELD | Admitting: Urology

## 2017-09-12 VITALS — BP 127/81 | HR 86 | Ht 70.0 in | Wt 205.4 lb

## 2017-09-12 DIAGNOSIS — N138 Other obstructive and reflux uropathy: Secondary | ICD-10-CM

## 2017-09-12 DIAGNOSIS — N529 Male erectile dysfunction, unspecified: Secondary | ICD-10-CM

## 2017-09-12 DIAGNOSIS — N401 Enlarged prostate with lower urinary tract symptoms: Secondary | ICD-10-CM | POA: Diagnosis not present

## 2017-09-12 DIAGNOSIS — E349 Endocrine disorder, unspecified: Secondary | ICD-10-CM | POA: Diagnosis not present

## 2017-09-12 LAB — BLADDER SCAN AMB NON-IMAGING: Scan Result: 251

## 2017-09-12 MED ORDER — TAMSULOSIN HCL 0.4 MG PO CAPS: 0.4000 mg | ORAL_CAPSULE | Freq: Every day | ORAL | 3 refills | Status: DC

## 2017-09-13 ENCOUNTER — Other Ambulatory Visit: Payer: BLUE CROSS/BLUE SHIELD

## 2017-09-13 ENCOUNTER — Other Ambulatory Visit: Payer: Self-pay

## 2017-09-13 DIAGNOSIS — R7989 Other specified abnormal findings of blood chemistry: Secondary | ICD-10-CM

## 2017-09-14 LAB — TESTOSTERONE: Testosterone: 292 ng/dL (ref 264–916)

## 2017-09-18 ENCOUNTER — Telehealth: Payer: Self-pay

## 2017-09-18 DIAGNOSIS — E291 Testicular hypofunction: Secondary | ICD-10-CM

## 2017-09-18 NOTE — Telephone Encounter (Signed)
-----   Message from Nori Riis, PA-C sent at 09/14/2017  7:40 AM EDT ----- Please let Ethan Fox know that his first testosterone level is under 300.  We will need to confirm, so we will need a second blood draw before 9 AM.

## 2017-09-18 NOTE — Telephone Encounter (Signed)
Spoke with pt in reference to lab results. Made aware will need 1 more lab draw before 9. Pt voiced understanding. Lab appt made and orders placed.

## 2017-09-19 ENCOUNTER — Other Ambulatory Visit: Payer: BLUE CROSS/BLUE SHIELD

## 2017-09-19 DIAGNOSIS — E291 Testicular hypofunction: Secondary | ICD-10-CM | POA: Diagnosis not present

## 2017-09-20 ENCOUNTER — Telehealth: Payer: Self-pay

## 2017-09-20 LAB — TESTOSTERONE: TESTOSTERONE: 276 ng/dL (ref 264–916)

## 2017-09-20 NOTE — Telephone Encounter (Signed)
Labs added at [facility].

## 2017-09-20 NOTE — Telephone Encounter (Signed)
-----   Message from Nori Riis, PA-C sent at 09/20/2017  7:42 AM EDT ----- Please add a LH and prolactin level to his blood work.

## 2017-09-21 ENCOUNTER — Encounter: Payer: Self-pay | Admitting: Family Medicine

## 2017-09-21 ENCOUNTER — Telehealth: Payer: Self-pay

## 2017-09-21 ENCOUNTER — Other Ambulatory Visit: Payer: Self-pay | Admitting: Family Medicine

## 2017-09-21 DIAGNOSIS — E781 Pure hyperglyceridemia: Secondary | ICD-10-CM

## 2017-09-21 DIAGNOSIS — E119 Type 2 diabetes mellitus without complications: Secondary | ICD-10-CM

## 2017-09-21 DIAGNOSIS — D72829 Elevated white blood cell count, unspecified: Secondary | ICD-10-CM | POA: Insufficient documentation

## 2017-09-21 NOTE — Telephone Encounter (Signed)
Pt's girlfriend is requesting a lab order for repeat lipid panel and A1C to be drawn prior to appointment on 09/28/2017. Per Dr. Meredeth Ide was advised that Dr. Jacinto Reap will look at chart and order other needed labs, and to return tomorrow for lab draw. Also, Sharyn Lull states pt is still having decreased urinary output.

## 2017-09-21 NOTE — Telephone Encounter (Signed)
Orders placed for CBC, CMP, A1c, lipid panel.  There is a chance that insurance may not cover lipid panel if checked more than every 12 months.  Virginia Crews, MD, MPH [facility] 09/21/2017 3:23 PM

## 2017-09-27 LAB — FSH/LH
FSH: 6 m[IU]/mL (ref 1.5–12.4)
LH: 7.2 m[IU]/mL (ref 1.7–8.6)

## 2017-09-27 LAB — SPECIMEN STATUS REPORT

## 2017-09-27 LAB — PROLACTIN: PROLACTIN: 16 ng/mL — AB (ref 4.0–15.2)

## 2017-09-28 ENCOUNTER — Encounter: Payer: Self-pay | Admitting: Family Medicine

## 2017-09-28 ENCOUNTER — Ambulatory Visit (INDEPENDENT_AMBULATORY_CARE_PROVIDER_SITE_OTHER): Payer: BLUE CROSS/BLUE SHIELD | Admitting: Family Medicine

## 2017-09-28 VITALS — BP 120/74 | HR 84 | Temp 98.0°F | Resp 16 | Wt 206.0 lb

## 2017-09-28 DIAGNOSIS — Z23 Encounter for immunization: Secondary | ICD-10-CM

## 2017-09-28 DIAGNOSIS — K5900 Constipation, unspecified: Secondary | ICD-10-CM

## 2017-09-28 DIAGNOSIS — Z720 Tobacco use: Secondary | ICD-10-CM | POA: Diagnosis not present

## 2017-09-28 DIAGNOSIS — F331 Major depressive disorder, recurrent, moderate: Secondary | ICD-10-CM

## 2017-09-28 DIAGNOSIS — S41102A Unspecified open wound of left upper arm, initial encounter: Secondary | ICD-10-CM | POA: Diagnosis not present

## 2017-09-28 DIAGNOSIS — E119 Type 2 diabetes mellitus without complications: Secondary | ICD-10-CM

## 2017-09-28 LAB — CBC WITH DIFFERENTIAL/PLATELET
BASOS ABS: 38 {cells}/uL (ref 0–200)
Basophils Relative: 0.5 %
EOS ABS: 98 {cells}/uL (ref 15–500)
Eosinophils Relative: 1.3 %
HEMATOCRIT: 44.7 % (ref 38.5–50.0)
HEMOGLOBIN: 15.6 g/dL (ref 13.2–17.1)
LYMPHS ABS: 1890 {cells}/uL (ref 850–3900)
MCH: 29.9 pg (ref 27.0–33.0)
MCHC: 34.9 g/dL (ref 32.0–36.0)
MCV: 85.6 fL (ref 80.0–100.0)
MPV: 11.6 fL (ref 7.5–12.5)
Monocytes Relative: 7.6 %
NEUTROS ABS: 4905 {cells}/uL (ref 1500–7800)
Neutrophils Relative %: 65.4 %
Platelets: 168 10*3/uL (ref 140–400)
RBC: 5.22 10*6/uL (ref 4.20–5.80)
RDW: 12.9 % (ref 11.0–15.0)
Total Lymphocyte: 25.2 %
WBC mixed population: 570 cells/uL (ref 200–950)
WBC: 7.5 10*3/uL (ref 3.8–10.8)

## 2017-09-28 LAB — COMPLETE METABOLIC PANEL WITH GFR
AG Ratio: 2 (calc) (ref 1.0–2.5)
ALBUMIN MSPROF: 4.5 g/dL (ref 3.6–5.1)
ALT: 27 U/L (ref 9–46)
AST: 15 U/L (ref 10–40)
Alkaline phosphatase (APISO): 66 U/L (ref 40–115)
BUN: 13 mg/dL (ref 7–25)
CALCIUM: 9.2 mg/dL (ref 8.6–10.3)
CO2: 28 mmol/L (ref 20–32)
CREATININE: 0.84 mg/dL (ref 0.60–1.35)
Chloride: 104 mmol/L (ref 98–110)
GFR, EST NON AFRICAN AMERICAN: 106 mL/min/{1.73_m2} (ref >=60)
GFR, Est African American: 123 mL/min/{1.73_m2} (ref >=60)
Globulin: 2.3 g/dL (calc) (ref 1.9–3.7)
Glucose, Bld: 110 mg/dL — ABNORMAL HIGH (ref 65–99)
Potassium: 4 mmol/L (ref 3.5–5.3)
SODIUM: 138 mmol/L (ref 135–146)
Total Bilirubin: 0.3 mg/dL (ref 0.2–1.2)
Total Protein: 6.8 g/dL (ref 6.1–8.1)

## 2017-09-28 LAB — POCT GLYCOSYLATED HEMOGLOBIN (HGB A1C)
Est. average glucose Bld gHb Est-mCnc: 151
Hemoglobin A1C: 6.9

## 2017-09-28 LAB — POCT UA - MICROALBUMIN: Microalbumin Ur, POC: 20 mg/L

## 2017-09-28 NOTE — Assessment & Plan Note (Signed)
Well-controlled with A1c 6.9 Continue metformin 500 mg twice a day Physical exam completed today We'll request records from last eye exam Pneumovax given today Microalbumin normal today Follow-up in 6 months

## 2017-09-28 NOTE — Patient Instructions (Signed)
Steps to Quit Smoking Smoking tobacco can be harmful to your health and can affect almost every organ in your body. Smoking puts you, and those around you, at risk for developing many serious chronic diseases. Quitting smoking is difficult, but it is one of the best things that you can do for your health. It is never too late to quit. What are the benefits of quitting smoking? When you quit smoking, you lower your risk of developing serious diseases and conditions, such as:  Lung cancer or lung disease, such as COPD.  Heart disease.  Stroke.  Heart attack.  Infertility.  Osteoporosis and bone fractures.  Additionally, symptoms such as coughing, wheezing, and shortness of breath may get better when you quit. You may also find that you get sick less often because your body is stronger at fighting off colds and infections. If you are pregnant, quitting smoking can help to reduce your chances of having a baby of low birth weight. How do I get ready to quit? When you decide to quit smoking, create a plan to make sure that you are successful. Before you quit:  Pick a date to quit. Set a date within the next two weeks to give you time to prepare.  Write down the reasons why you are quitting. Keep this list in places where you will see it often, such as on your bathroom mirror or in your car or wallet.  Identify the people, places, things, and activities that make you want to smoke (triggers) and avoid them. Make sure to take these actions: ? Throw away all cigarettes at home, at work, and in your car. ? Throw away smoking accessories, such as ashtrays and lighters. ? Clean your car and make sure to empty the ashtray. ? Clean your home, including curtains and carpets.  Tell your family, friends, and coworkers that you are quitting. Support from your loved ones can make quitting easier.  Talk with your health care provider about your options for quitting smoking.  Find out what treatment  options are covered by your health insurance.  What strategies can I use to quit smoking? Talk with your healthcare provider about different strategies to quit smoking. Some strategies include:  Quitting smoking altogether instead of gradually lessening how much you smoke over a period of time. Research shows that quitting "cold turkey" is more successful than gradually quitting.  Attending in-person counseling to help you build problem-solving skills. You are more likely to have success in quitting if you attend several counseling sessions. Even short sessions of 10 minutes can be effective.  Finding resources and support systems that can help you to quit smoking and remain smoke-free after you quit. These resources are most helpful when you use them often. They can include: ? Online chats with a counselor. ? Telephone quitlines. ? Printed self-help materials. ? Support groups or group counseling. ? Text messaging programs. ? Mobile phone applications.  Taking medicines to help you quit smoking. (If you are pregnant or breastfeeding, talk with your health care provider first.) Some medicines contain nicotine and some do not. Both types of medicines help with cravings, but the medicines that include nicotine help to relieve withdrawal symptoms. Your health care provider may recommend: ? Nicotine patches, gum, or lozenges. ? Nicotine inhalers or sprays. ? Non-nicotine medicine that is taken by mouth.  Talk with your health care provider about combining strategies, such as taking medicines while you are also receiving in-person counseling. Using these two strategies together   makes you more likely to succeed in quitting than if you used either strategy on its own. If you are pregnant or breastfeeding, talk with your health care provider about finding counseling or other support strategies to quit smoking. Do not take medicine to help you quit smoking unless told to do so by your health care  provider. What things can I do to make it easier to quit? Quitting smoking might feel overwhelming at first, but there is a lot that you can do to make it easier. Take these important actions:  Reach out to your family and friends and ask that they support and encourage you during this time. Call telephone quitlines, reach out to support groups, or work with a counselor for support.  Ask people who smoke to avoid smoking around you.  Avoid places that trigger you to smoke, such as bars, parties, or smoke-break areas at work.  Spend time around people who do not smoke.  Lessen stress in your life, because stress can be a smoking trigger for some people. To lessen stress, try: ? Exercising regularly. ? Deep-breathing exercises. ? Yoga. ? Meditating. ? Performing a body scan. This involves closing your eyes, scanning your body from head to toe, and noticing which parts of your body are particularly tense. Purposefully relax the muscles in those areas.  Download or purchase mobile phone or tablet apps (applications) that can help you stick to your quit plan by providing reminders, tips, and encouragement. There are many free apps, such as QuitGuide from the CDC (Centers for Disease Control and Prevention). You can find other support for quitting smoking (smoking cessation) through smokefree.gov and other websites.  How will I feel when I quit smoking? Within the first 24 hours of quitting smoking, you may start to feel some withdrawal symptoms. These symptoms are usually most noticeable 2-3 days after quitting, but they usually do not last beyond 2-3 weeks. Changes or symptoms that you might experience include:  Mood swings.  Restlessness, anxiety, or irritation.  Difficulty concentrating.  Dizziness.  Strong cravings for sugary foods in addition to nicotine.  Mild weight gain.  Constipation.  Nausea.  Coughing or a sore throat.  Changes in how your medicines work in your  body.  A depressed mood.  Difficulty sleeping (insomnia).  After the first 2-3 weeks of quitting, you may start to notice more positive results, such as:  Improved sense of smell and taste.  Decreased coughing and sore throat.  Slower heart rate.  Lower blood pressure.  Clearer skin.  The ability to breathe more easily.  Fewer sick days.  Quitting smoking is very challenging for most people. Do not get discouraged if you are not successful the first time. Some people need to make many attempts to quit before they achieve long-term success. Do your best to stick to your quit plan, and talk with your health care provider if you have any questions or concerns. This information is not intended to replace advice given to you by your health care provider. Make sure you discuss any questions you have with your health care provider. Document Released: 11/15/2001 Document Revised: 07/19/2016 Document Reviewed: 04/07/2015 Elsevier Interactive Patient Education  2017 [employer]vier Inc.  

## 2017-09-28 NOTE — Addendum Note (Signed)
Addended by: Brennan Bailey on: 09/28/2017 01:23 PM   Modules accepted: Orders
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
## 2017-09-28 NOTE — Assessment & Plan Note (Signed)
For nonhealing erythematous areas of left arm areas of a lot of sun exposure Referral to dermatology for further evaluation, possible biopsy Concern is that he has nonhealing wounds could be nonmelanoma skin cancer

## 2017-09-28 NOTE — Assessment & Plan Note (Signed)
Improved Improved exercise habits and diet likely helping Continue Mira lax and Colace

## 2017-09-28 NOTE — Assessment & Plan Note (Addendum)
Patient states this is well controlled now, though PHQ9 score is the same Thinks that he was depressed due to the stress in his life of his girlfriend that he was dating He does not want to take any medications at this time Advised to follow-up if he has a recurrence leaking consider starting Celexa at that time

## 2017-09-28 NOTE — Assessment & Plan Note (Signed)
Counseled on importance of cessation and consequences of long-term tobacco use Patient states he is not currently ready to quit, but thinks he may be within the next month Given information about quit line Advised patient to schedule follow-up appointment if he wishes to discuss medical management of cessation or options when he is thinking about quitting We will continue to discuss at follow-up visit

## 2017-10-02 DIAGNOSIS — L281 Prurigo nodularis: Secondary | ICD-10-CM | POA: Diagnosis not present

## 2017-10-11 NOTE — Progress Notes (Signed)
10/12/2017 11:54 AM   Lenor Derrick Jr. [birth year] [id number]  Referring provider: Virginia Crews, MD [address] [place], [place] [address]  Chief Complaint  Patient presents with  . Follow-up    BPH, ED    HPI: Patient is a 46 year old Caucasian male with DM, BPH with LU TS, testosterone deficiency and ED who presents today for a one month follow up.    BPH with LUTS (prostate and/or bladder) His IPSS score today is 10, which is moderate lower urinary tract symptomatology.  He is mostly satisfied with his quality life due to his urinary symptoms.  His PVR is 167 mL.  His previous I PSS score was 17/4.  His previous PVR is 251 mL.  His major complaint today is a weak urinary stream.  He has had these symptoms for two years.  He denies any dysuria, hematuria or suprapubic pain.  He feels tamsulosin is helping.    He also denies any recent fevers, chills, nausea or vomiting.  He does not have a family history of PCa.  IPSS    Row Name 09/12/17 1100 10/12/17 1100       International Prostate Symptom Score   How often have you had the sensation of not emptying your bladder?  About half the time  About half the time    How often have you had to urinate less than every two hours?  Less than 1 in 5 times  Less than 1 in 5 times    How often have you found you stopped and started again several times when you urinated?  About half the time  Less than 1 in 5 times    How often have you found it difficult to postpone urination?  More than half the time  Less than 1 in 5 times    How often have you had a weak urinary stream?  Almost always  About half the time    How often have you had to strain to start urination?  Not at All  Less than 1 in 5 times    How many times did you typically get up at night to urinate?  1 Time  None    Total IPSS Score  17  10      Quality of Life due to urinary symptoms   If you were to spend the rest of your life with your urinary  condition just the way it is now how would you feel about that?  Unhappy  Mostly Satisfied       Score:  1-7 Mild 8-19 Moderate 20-35 Severe   Testosterone deficiency Patient is experiencing a decrease in libido, a lack of energy, a decrease in strength, a decreased enjoyment in life, sadness and/or grumpiness, erections being less strong and a recent deterioration in an ability to play sports.  This is indicated by his responses to the ADAM questionnaire.  He is still having spontaneous erections at night.  Girlfriend states that she feels she has witnessed apneic episodes while he was sleeping.  He has not had a formal sleep study.   He is reporting a loss of body hair, reduced beard growth, obesity, poor memory, type 2 diabetes mellitus.  He has been on testosterone cypionate in the past.   Patient has met criteria with 2 morning testosterone is below 300, 2 days apart.  Androgen Deficiency in the Aging Male    [place] Name 09/12/17 1100  Androgen Deficiency in the Aging Male   Do you have a decrease in libido (sex drive)  Yes     Do you have lack of energy  Yes     Do you have a decrease in strength and/or endurance  Yes     Have you lost height  No     Have you noticed a decreased "enjoyment of life"  Yes     Are you sad and/or grumpy  Yes     Are your erections less strong  Yes     Have you noticed a recent deterioration in your ability to play sports  Yes     Are you falling asleep after dinner  No     Has there been a recent deterioration in your work performance  No       Erectile dysfunction He has been having difficulty with erections for a few years.   His previous SHIM score was 19.  His major complaint is not having erections when he is wanting to have sex.  His libido is diminished.   His risk factors for ED are age, BPH, DM, HLD, sleep apnea, night shift work and smoking.   He denies any painful erections or curvatures with his erections.   He is still having  spontaneous erections.   SHIM    Row Name 09/12/17 1124         SHIM: Over the last 6 months:   How do you rate your confidence that you could get and keep an erection?  Moderate     When you had erections with sexual stimulation, how often were your erections hard enough for penetration (entering your partner)?  Sometimes (about half the time)     During sexual intercourse, how often were you able to maintain your erection after you had penetrated (entered) your partner?  Most Times (much more than half the time)     During sexual intercourse, how difficult was it to maintain your erection to completion of intercourse?  Not Difficult     When you attempted sexual intercourse, how often was it satisfactory for you?  Most Times (much more than half the time)       SHIM Total Score   SHIM  19        Score: 1-7 Severe ED 8-11 Moderate ED 12-16 Mild-Moderate ED 17-21 Mild ED 22-25 No ED   PMH: Past Medical History:  Diagnosis Date  . Constipation   . Diabetes mellitus without complication ([place])   . GERD (gastroesophageal reflux disease)   . Hiatal hernia     Surgical History: Past Surgical History:  Procedure Laterality Date  . NO PAST SURGERIES      Home Medications:  Allergies as of 10/12/2017   No Known Allergies     Medication List        Accurate as of 10/12/17 11:54 AM. Always use your most recent med list.          aspirin 81 MG chewable tablet Chew 81 mg by mouth daily.   clomiPHENE 50 MG tablet Commonly known as:  CLOMID Take 1/2 tablet daily   docusate sodium 100 MG capsule Commonly known as:  COLACE Take 1 capsule (100 mg total) by mouth 2 (two) times daily.   metFORMIN 500 MG 24 hr tablet Commonly known as:  GLUCOPHAGE-XR Take 1 tablet by mouth 2 (two) times daily.   omeprazole 20 MG capsule Commonly known as:  PRILOSEC Take 20 mg  by mouth daily.   OVER THE COUNTER MEDICATION Mushrooms Cordyceps Energy Support   polyethylene glycol  powder powder Commonly known as:  GLYCOLAX/MIRALAX Take 17 g by mouth 2 (two) times daily.   rosuvastatin 40 MG tablet Commonly known as:  CRESTOR Take 1 tablet by mouth daily.   tamsulosin 0.4 MG Caps capsule Commonly known as:  FLOMAX Take 1 capsule (0.4 mg total) by mouth daily.       Allergies: No Known Allergies  Family History: Family History  Problem Relation Age of Onset  . COPD Mother 57       smoker  . Kidney disease Mother   . Diabetes Mother   . Pancreatic cancer Mother   . Diabetes Father   . Hypothyroidism Sister   . Heart disease Maternal Grandmother   . Heart disease Maternal Grandfather   . Heart disease Paternal Grandmother 20       died of MI  . Colon cancer Neg Hx   . Breast cancer Neg Hx   . Prostate cancer Neg Hx   . Kidney cancer Neg Hx   . Bladder Cancer Neg Hx     Social History:  reports that he has been smoking cigarettes.  He has a 15.00 pack-year smoking history. he has never used smokeless tobacco. He reports that he drinks alcohol. He reports that he does not use drugs.  ROS: UROLOGY Frequent Urination?: No Hard to postpone urination?: No Burning/pain with urination?: No Get up at night to urinate?: No Leakage of urine?: No Urine stream starts and stops?: No Trouble starting stream?: No Do you have to strain to urinate?: No Blood in urine?: No Urinary tract infection?: No Sexually transmitted disease?: No Injury to kidneys or bladder?: No Painful intercourse?: No Weak stream?: No Erection problems?: No Penile pain?: No  Gastrointestinal Nausea?: No Vomiting?: No Indigestion/heartburn?: No Diarrhea?: No Constipation?: No  Constitutional Fever: No Night sweats?: No Weight loss?: No Fatigue?: No  Skin Skin rash/lesions?: No Itching?: No  Eyes Blurred vision?: No Double vision?: No  Ears/Nose/Throat Sore throat?: No Sinus problems?: No  Hematologic/Lymphatic Swollen glands?: No Easy bruising?:  No  Cardiovascular Leg swelling?: No Chest pain?: No  Respiratory Cough?: No Shortness of breath?: No  Endocrine Excessive thirst?: No  Musculoskeletal Back pain?: No Joint pain?: No  Neurological Headaches?: No Dizziness?: No  Psychologic Depression?: No Anxiety?: No  Physical Exam: BP 127/77   Pulse (!) 54   Ht '5\' 10"'  (1.778 m)   Wt 206 lb 9.6 oz (93.7 kg)   BMI 29.64 kg/m   Constitutional: Well nourished. Alert and oriented, No acute distress. HEENT: [place] AT, moist mucus membranes. Trachea midline, no masses. Cardiovascular: No clubbing, cyanosis, or edema. Respiratory: Normal respiratory effort, no increased work of breathing. Skin: No rashes, bruises or suspicious lesions. Lymph: No cervical or inguinal adenopathy. Neurologic: Grossly intact, no focal deficits, moving all 4 extremities. Psychiatric: Normal mood and affect.  Laboratory Data: Lab Results  Component Value Date   WBC 7.5 09/28/2017   HGB 15.6 09/28/2017   HCT 44.7 09/28/2017   MCV 85.6 09/28/2017   PLT 168 09/28/2017    Lab Results  Component Value Date   CREATININE 0.84 09/28/2017    Lab Results  Component Value Date   AST 15 09/28/2017   Lab Results  Component Value Date   ALT 27 09/28/2017   Results for orders placed or performed in visit on 10/12/17  Bladder Scan (Post Void Residual) in office  Result Value Ref Range   Scan Result 167 ML    I have reviewed the labs.   Pertinent Imaging: Results for Pelphrey, Pearce JR. "Radonna Ricker (MRN [id number]) as of 10/12/2017 11:46  Ref. Range 10/12/2017 11:37  Scan Result Unknown 167 ML    I have independently reviewed the films.    Assessment & Plan:    1. BPH with LUTS  - IPSS score is 10/2, it is improving  - Continue conservative management, avoiding bladder irritants and timed voiding's  - most bothersome symptoms is/are weak stream  - continue alpha-blocker (tamsulosin 0.4 mg), discussed side effects   - PSA today  2.  Testosterone deficiency  -Patient has met criteria with 2 testosterone levels drawn 2 days apart below 300 ng/dL    -I discussed with the patient the side effects of testosterone therapy, such as: enlargement of the prostate gland that may in turn cause LUTS, possible increased risk of PCa, DVT's and/or PE's, possible increased risk of heart attack or stroke, lower sperm count, swelling of the ankles, feet, or body, with or without heart failure, enlarged or painful breasts, have problems breathing while you sleep (sleep apnea), increased prostate specific antigen, mood swings, hypertension and increased red blood cell count - we will need to check HCT/HBG levels prior to therapy and PSA if the patient is over 40 - he needs a sleep study prior to starting any exogenous testosterone therapy if he meets criteria  - I also discussed that some men have had success using clomid for testosterone deficiency.  It does seem to be more successful in younger men, but there are incidences of good results in middle-aged men.  I explained that it is used in male infertility to stimulate the testicles to make more testosterone/sperm.  There has been no long term data on side effects, but some urologists has been having success with this medication.   - Clomid 50 mg, 1/2 tablet daily, # 30 - RTC in one month for morning testosterone level before 10 am  3. Erectile dysfunction  - SHIM score is 19  - will reassess once testosterone level is therapeutic   Return in about 1 month (around 11/11/2017) for morning testosterone level before 10 am.  These notes generated with voice recognition software. I apologize for typographical errors.  Zara Council, [facility] [address] [place], [place] [address] [phone]

## 2017-10-12 ENCOUNTER — Ambulatory Visit: Payer: BLUE CROSS/BLUE SHIELD | Admitting: Urology

## 2017-10-12 ENCOUNTER — Encounter: Payer: Self-pay | Admitting: Urology

## 2017-10-12 ENCOUNTER — Telehealth: Payer: Self-pay | Admitting: Urology

## 2017-10-12 VITALS — BP 127/77 | HR 54 | Ht 70.0 in | Wt 206.6 lb

## 2017-10-12 DIAGNOSIS — N138 Other obstructive and reflux uropathy: Secondary | ICD-10-CM | POA: Diagnosis not present

## 2017-10-12 DIAGNOSIS — N529 Male erectile dysfunction, unspecified: Secondary | ICD-10-CM | POA: Diagnosis not present

## 2017-10-12 DIAGNOSIS — E349 Endocrine disorder, unspecified: Secondary | ICD-10-CM

## 2017-10-12 DIAGNOSIS — N401 Enlarged prostate with lower urinary tract symptoms: Secondary | ICD-10-CM

## 2017-10-12 LAB — BLADDER SCAN AMB NON-IMAGING
Scan Result: 167
Scan Result: 167

## 2017-10-12 MED ORDER — CLOMIPHENE CITRATE 50 MG PO TABS: ORAL_TABLET | ORAL | 3 refills | Status: DC

## 2017-10-12 NOTE — Addendum Note (Signed)
Addended by: Phillips Odor on: 10/12/2017 12:07 PM   Modules accepted: Orders

## 2017-10-12 NOTE — Addendum Note (Signed)
Addended by: Phillips Odor on: 10/12/2017 04:24 PM   Modules accepted: Orders

## 2017-10-12 NOTE — Telephone Encounter (Signed)
Pt went to [facility] [address] and medicine, it was going to cost $98 for a two month supply.  He would like meds called in to [facility].  Please call pt [phone]

## 2017-10-13 ENCOUNTER — Telehealth: Payer: Self-pay

## 2017-10-13 DIAGNOSIS — N401 Enlarged prostate with lower urinary tract symptoms: Secondary | ICD-10-CM

## 2017-10-13 LAB — PSA: PROSTATE SPECIFIC AG, SERUM: 2.6 ng/mL (ref 0.0–4.0)

## 2017-10-13 MED ORDER — CLOMIPHENE CITRATE 50 MG PO TABS: ORAL_TABLET | ORAL | 3 refills | Status: DC

## 2017-10-13 NOTE — Telephone Encounter (Signed)
-----   Message from Nori Riis, PA-C sent at 10/13/2017  7:57 AM EST ----- Please let DJ know that his PSA is slightly elevated for his age group.  We will need to monitor the levels on a consistent basis to establish a trend.   I would like to repeat the PSA in three months.

## 2017-10-13 NOTE — Telephone Encounter (Signed)
Clomid has been sent to [facility]. Pt made aware.

## 2017-10-13 NOTE — Telephone Encounter (Signed)
Spoke with pt in reference to PSA results. Made aware will need to have labs again in 4mo. Pt voiced understanding. Lab appt made and orders placed.

## 2017-11-14 ENCOUNTER — Telehealth: Payer: Self-pay | Admitting: Urology
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
## 2017-11-15 ENCOUNTER — Other Ambulatory Visit: Payer: BLUE CROSS/BLUE SHIELD

## 2017-11-24 ENCOUNTER — Other Ambulatory Visit: Payer: BLUE CROSS/BLUE SHIELD

## 2018-01-17 ENCOUNTER — Encounter: Payer: Self-pay | Admitting: Urology

## 2018-01-17 ENCOUNTER — Other Ambulatory Visit: Payer: Self-pay

## 2018-02-01 ENCOUNTER — Telehealth: Payer: Self-pay | Admitting: Family Medicine

## 2018-02-01 NOTE — Telephone Encounter (Signed)
Pt's girlfriend requesting a refill of Crestor 40 MG it be [facility] [address]    Marisa Sprinkles girlfriend requesting call back when called [phone]

## 2018-02-02 MED ORDER — ROSUVASTATIN CALCIUM 40 MG PO TABS: 40.0000 mg | ORAL_TABLET | Freq: Every day | ORAL | 11 refills | Status: DC

## 2018-02-02 NOTE — Telephone Encounter (Signed)
Refills sent.  Will need to ensure that girlfriend is on DPR before letting her know.  Virginia Crews, MD, MPH [facility] 02/02/2018 8:17 AM

## 2018-02-02 NOTE — Telephone Encounter (Signed)
Girlfriend is on [place] and has been informed.  ED

## 2018-02-12 ENCOUNTER — Other Ambulatory Visit: Payer: BLUE CROSS/BLUE SHIELD

## 2018-02-12 DIAGNOSIS — N401 Enlarged prostate with lower urinary tract symptoms: Secondary | ICD-10-CM

## 2018-02-13 LAB — PSA: PROSTATE SPECIFIC AG, SERUM: 3.2 ng/mL (ref 0.0–4.0)

## 2018-02-14 ENCOUNTER — Telehealth: Payer: Self-pay

## 2018-02-14 NOTE — Telephone Encounter (Signed)
Pt informed, appt scheduled for 02/15/18

## 2018-02-14 NOTE — Progress Notes (Signed)
02/15/2018 7:52 AM   [employer]. 15-May-1971 [id number]  Referring provider: Virginia Crews, MD [address] [place], [place] [address]  No chief complaint on file.   HPI: Patient is a 47 year old Caucasian male with DM, BPH with LU TS, testosterone deficiency and ED who presents today for a rising PSA.   Rising PSA Patient was started on Clomid on 10/12/2017 for testosterone deficiency.  His PSA at that time was 2.6.  It is now 3.2.  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    BPH with LUTS (prostate and/or bladder) His IPSS score on 10/12/2018 was 10, which is moderate lower urinary tract symptomatology.  He is mostly satisfied with his quality life due to his urinary symptoms.  His PVR is 167 mL.  His previous I PSS score was 17/4.  His previous PVR is 251 mL.  His major complaint today is a weak urinary stream.  He has had these symptoms for two years.  He denies any dysuria, hematuria or suprapubic pain.  He feels tamsulosin is helping.    He also denies any recent fevers, chills, nausea or vomiting.  He does not have a family history of PCa.    Score:  1-7 Mild 8-19 Moderate 20-35 Severe   Testosterone deficiency He is still having spontaneous erections at night.  Girlfriend states that she feels she has witnessed apneic episodes while he was sleeping.  He has not had a formal sleep study.   He is reporting a loss of body hair, reduced beard growth, obesity, poor memory, type 2 diabetes mellitus.  He has been on testosterone cypionate in the past.   Patient has met criteria with 2 morning testosterone is below 300, 2 days apart.   Erectile dysfunction He has been having difficulty with erections for a few years.   His previous SHIM score was 19.  His major complaint is not having erections when he is wanting to have sex.  His libido is diminished.   His risk factors for ED are age, BPH, DM, HLD, sleep apnea, night shift work  and smoking.   He denies any painful erections or curvatures with his erections.   He is still having spontaneous erections.     Score: 1-7 Severe ED 8-11 Moderate ED 12-16 Mild-Moderate ED 17-21 Mild ED 22-25 No ED   PMH: Past Medical History:  Diagnosis Date  . Constipation   . Diabetes mellitus without complication ([place])   . GERD (gastroesophageal reflux disease)   . Hiatal hernia     Surgical History: Past Surgical History:  Procedure Laterality Date  . NO PAST SURGERIES      Home Medications:  Allergies as of 02/15/2018   No Known Allergies     Medication List        Accurate as of 02/15/18 11:59 PM. Always use your most recent med list.          aspirin 81 MG chewable tablet Chew 81 mg by mouth daily.   clomiPHENE 50 MG tablet Commonly known as:  CLOMID Take 1/2 tablet daily   docusate sodium 100 MG capsule Commonly known as:  COLACE Take 1 capsule (100 mg total) by mouth 2 (two) times daily.   metFORMIN 500 MG 24 hr tablet Commonly known as:  GLUCOPHAGE-XR Take 1 tablet by mouth 2 (two) times daily.   omeprazole 20 MG capsule Commonly known as:  PRILOSEC Take 20 mg by  mouth daily.   OVER THE COUNTER MEDICATION Mushrooms Cordyceps Energy Support   polyethylene glycol powder powder Commonly known as:  GLYCOLAX/MIRALAX Take 17 g by mouth 2 (two) times daily.   rosuvastatin 40 MG tablet Commonly known as:  CRESTOR Take 1 tablet (40 mg total) by mouth daily.   tamsulosin 0.4 MG Caps capsule Commonly known as:  FLOMAX Take 1 capsule (0.4 mg total) by mouth daily.       Allergies: No Known Allergies  Family History: Family History  Problem Relation Age of Onset  . COPD Mother 29       smoker  . Kidney disease Mother   . Diabetes Mother   . Pancreatic cancer Mother   . Diabetes Father   . Hypothyroidism Sister   . Heart disease Maternal Grandmother   . Heart disease Maternal Grandfather   . Heart disease Paternal Grandmother 104        died of MI  . Colon cancer Neg Hx   . Breast cancer Neg Hx   . Prostate cancer Neg Hx   . Kidney cancer Neg Hx   . Bladder Cancer Neg Hx     Social History:  reports that he has been smoking cigarettes.  He has a 15.00 pack-year smoking history. He has never used smokeless tobacco. He reports that he drinks alcohol. He reports that he does not use drugs.  ROS: UROLOGY Frequent Urination?: No Hard to postpone urination?: No Burning/pain with urination?: No Get up at night to urinate?: No Leakage of urine?: No Urine stream starts and stops?: No Trouble starting stream?: No Do you have to strain to urinate?: No Blood in urine?: No Urinary tract infection?: No Sexually transmitted disease?: No Injury to kidneys or bladder?: No Painful intercourse?: No Weak stream?: Yes Erection problems?: Yes Penile pain?: No  Gastrointestinal Nausea?: No Vomiting?: No Indigestion/heartburn?: Yes Diarrhea?: No Constipation?: Yes  Constitutional Fever: No Night sweats?: No Weight loss?: No Fatigue?: No  Skin Skin rash/lesions?: No Itching?: No  Eyes Blurred vision?: No Double vision?: No  Ears/Nose/Throat Sore throat?: No Sinus problems?: No  Hematologic/Lymphatic Swollen glands?: No Easy bruising?: Yes  Cardiovascular Leg swelling?: No Chest pain?: No  Respiratory Cough?: Yes Shortness of breath?: No  Endocrine Excessive thirst?: No  Musculoskeletal Back pain?: Yes Joint pain?: No  Neurological Headaches?: No Dizziness?: No  Psychologic Depression?: No Anxiety?: No  Physical Exam: BP 108/68   Pulse (!) 102   Resp 16   Ht '5\' 10"'  (1.778 m)   Wt 220 lb (99.8 kg)   SpO2 98%   BMI 31.57 kg/m   Constitutional: Well nourished. Alert and oriented, No acute distress. HEENT: [place] AT, moist mucus membranes. Trachea midline, no masses. Cardiovascular: No clubbing, cyanosis, or edema. Respiratory: Normal respiratory effort, no increased work of  breathing. GI: Abdomen is soft, non tender, non distended, no abdominal masses. Liver and spleen not palpable.  No hernias appreciated.  Stool sample for occult testing is not indicated.   GU: No CVA tenderness.  No bladder fullness or masses.  Patient with circumcised phallus. Urethral meatus is patent.  No penile discharge. No penile lesions or rashes. Scrotum without lesions, cysts, rashes and/or edema.  Testicles are located scrotally bilaterally. No masses are appreciated in the testicles. Left and right epididymis are normal. Rectal: Patient with  normal sphincter tone. Anus and perineum without scarring or rashes. No rectal masses are appreciated. Prostate is approximately 35 grams, no nodules are appreciated. Seminal vesicles are  normal. Skin: No rashes, bruises or suspicious lesions. Lymph: No cervical or inguinal adenopathy. Neurologic: Grossly intact, no focal deficits, moving all 4 extremities. Psychiatric: Normal mood and affect.  Laboratory Data: Lab Results  Component Value Date   WBC 7.5 09/28/2017   HGB 15.6 09/28/2017   HCT 44.7 09/28/2017   MCV 85.6 09/28/2017   PLT 168 09/28/2017    Lab Results  Component Value Date   CREATININE 0.84 09/28/2017    Lab Results  Component Value Date   AST 15 09/28/2017   Lab Results  Component Value Date   ALT 27 09/28/2017   Results for orders placed or performed in visit on 02/12/18  PSA  Result Value Ref Range   Prostate Specific Ag, Serum 3.2 0.0 - 4.0 ng/mL   I have reviewed the labs.   Pertinent Imaging: Results for Sugg, Primo JR. "Radonna Ricker (MRN [id number]) as of 10/12/2017 11:46  Ref. Range 10/12/2017 11:37  Scan Result Unknown 167 ML    I have independently reviewed the films.    Assessment & Plan:    1. Rising PSA Discussed the implication of the sharp rise in his PSA in a short amount of time and the concern for a possible prostate malignancy.  I offered the patient to recheck the PSA after the patient has  been off the Clomid for one month, obtain a 4K score or undergo a prostate biopsy at this time He would like to pursue a 4K score at this time - explained to the patient that the 4K score test result is the individual patient's risk for aggressive prostate cancer of Gleason's score 7 and higher if a prostate biopsy were to be performed. The 4K score is calculated from blood test results of four kallikrein proteins: Total PSA, Free PSA, Intact PSA and human kallikrein-related peptidase 2 (hK2), combined with patient age, DRE result (if reported), and history of no prostate biopsy or prior negative prostate biopsy RTC pending the results of 4K score  2. Testosterone deficiency Will hold therapy at this time until PSA issue is resolved  3. BPH with LUTS  - IPSS score is 10/2  - Continue conservative management, avoiding bladder irritants and timed voiding's  - most bothersome symptoms is/are weak stream  - continue tamsulosin 0.4 mg daily  4. Erectile dysfunction  - SHIM score is 19  - will reassess once testosterone level is therapeutic   Return for pending 4K score results .  These notes generated with voice recognition software. I apologize for typographical errors.  Zara Council, [facility] [address] [place], [place] [address] [phone]

## 2018-02-14 NOTE — Telephone Encounter (Signed)
-----   Message from Nori Riis, PA-C sent at 02/13/2018 10:53 AM EDT ----- Please let Mr. Alcock know that his PSA has increased again.  This is concerning.  He should have an office visit so that we can exam his prostate and talk about his next steps.

## 2018-02-15 ENCOUNTER — Ambulatory Visit: Payer: BLUE CROSS/BLUE SHIELD | Admitting: Urology

## 2018-02-15 ENCOUNTER — Encounter: Payer: Self-pay | Admitting: Urology

## 2018-02-15 VITALS — BP 108/68 | HR 102 | Resp 16 | Ht 70.0 in | Wt 220.0 lb

## 2018-02-15 DIAGNOSIS — N138 Other obstructive and reflux uropathy: Secondary | ICD-10-CM | POA: Diagnosis not present

## 2018-02-15 DIAGNOSIS — E349 Endocrine disorder, unspecified: Secondary | ICD-10-CM | POA: Diagnosis not present

## 2018-02-15 DIAGNOSIS — N401 Enlarged prostate with lower urinary tract symptoms: Secondary | ICD-10-CM | POA: Diagnosis not present

## 2018-02-15 DIAGNOSIS — R972 Elevated prostate specific antigen [PSA]: Secondary | ICD-10-CM

## 2018-02-15 DIAGNOSIS — N529 Male erectile dysfunction, unspecified: Secondary | ICD-10-CM

## 2018-02-19 ENCOUNTER — Other Ambulatory Visit: Payer: Self-pay | Admitting: Urology

## 2018-02-21 ENCOUNTER — Telehealth: Payer: Self-pay | Admitting: Urology

## 2018-02-21 DIAGNOSIS — R972 Elevated prostate specific antigen [PSA]: Secondary | ICD-10-CM

## 2018-02-21 NOTE — Telephone Encounter (Signed)
Please let Mr. Kulinski know that his 4 K score is 8%.  This means that if we preformed a biopsy of his prostate today, he would have an 8% chance that it would be positive for a high grade cancer.   I would recommend staying of the Clomid for one more month and rechecking his PSA to see if it comes down.

## 2018-02-22 NOTE — Telephone Encounter (Signed)
Spoke with pt in reference to 4K score results and staying off of clomid. Made pt aware will need to retest PSA in 68mo. Pt voiced understanding. Lab appt made and orders placed.

## 2018-03-26 ENCOUNTER — Other Ambulatory Visit: Payer: Self-pay

## 2018-03-28 ENCOUNTER — Other Ambulatory Visit: Payer: BLUE CROSS/BLUE SHIELD

## 2018-03-28 DIAGNOSIS — R972 Elevated prostate specific antigen [PSA]: Secondary | ICD-10-CM | POA: Diagnosis not present

## 2018-03-29 ENCOUNTER — Ambulatory Visit (INDEPENDENT_AMBULATORY_CARE_PROVIDER_SITE_OTHER): Payer: BLUE CROSS/BLUE SHIELD | Admitting: Family Medicine

## 2018-03-29 ENCOUNTER — Telehealth: Payer: Self-pay

## 2018-03-29 ENCOUNTER — Encounter: Payer: Self-pay | Admitting: Family Medicine

## 2018-03-29 VITALS — BP 112/84 | HR 98 | Temp 98.0°F | Resp 16 | Wt 222.0 lb

## 2018-03-29 DIAGNOSIS — Z23 Encounter for immunization: Secondary | ICD-10-CM | POA: Diagnosis not present

## 2018-03-29 DIAGNOSIS — E119 Type 2 diabetes mellitus without complications: Secondary | ICD-10-CM | POA: Diagnosis not present

## 2018-03-29 LAB — POCT GLYCOSYLATED HEMOGLOBIN (HGB A1C)
ESTIMATED AVERAGE GLUCOSE: 229
HEMOGLOBIN A1C: 9.6

## 2018-03-29 LAB — PSA: PROSTATE SPECIFIC AG, SERUM: 2.4 ng/mL (ref 0.0–4.0)

## 2018-03-29 MED ORDER — EMPAGLIFLOZIN 10 MG PO TABS: 10.0000 mg | ORAL_TABLET | Freq: Every day | ORAL | 2 refills | Status: DC

## 2018-03-29 NOTE — Telephone Encounter (Signed)
Spoke with pt in reference to lab results, clomid and labs in 57mo. Pt voiced understanding. Lab appt made.

## 2018-03-29 NOTE — Progress Notes (Signed)
Patient: [facility]. Male    DOB: [birth year]   47 y.o.   MRN: [id number] Visit Date: 03/29/2018  Today's Provider: Lavon Paganini, MD   I, Martha Clan, CMA, am acting as scribe for Lavon Paganini, MD.  Chief Complaint  Patient presents with  . Diabetes   Subjective:    HPI      Diabetes Mellitus Type II, Follow-up:   Lab Results  Component Value Date   HGBA1C 6.9 09/28/2017   HGBA1C 8.3 05/04/2017    Last seen for diabetes 6 months ago.  Management since then includes continuing Metformin XL 500 mg BID. He reports good compliance with treatment. He is not having side effects.  Current symptoms include polyuria and have been stable. Home blood sugar records: fasting range: not being checked  Episodes of hypoglycemia? 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  Pertinent Labs:    Component Value Date/Time   CHOL 154 05/04/2017   TRIG 282 (A) 05/04/2017   HDL 29 (A) 05/04/2017   LDLCALC 69 05/04/2017   CREATININE 0.84 09/28/2017 1154    Wt Readings from Last 3 Encounters:  03/29/18 222 lb (100.7 kg)  02/15/18 220 lb (99.8 kg)  10/12/17 206 lb 9.6 oz (93.7 kg)    ------------------------------------------------------------------------   No Known Allergies   Current Outpatient Medications:  .  aspirin 81 MG chewable tablet, Chew 81 mg by mouth daily., Disp: , Rfl:  .  metFORMIN (GLUCOPHAGE-XR) 500 MG 24 hr tablet, Take 1 tablet by mouth 2 (two) times daily., Disp: , Rfl:  .  omeprazole (PRILOSEC) 20 MG capsule, Take 20 mg by mouth daily., Disp: , Rfl:  .  polyethylene glycol powder (GLYCOLAX/MIRALAX) powder, Take 17 g by mouth 2 (two) times daily., Disp: 3350 g, Rfl: 3 .  rosuvastatin  (CRESTOR) 40 MG tablet, Take 1 tablet (40 mg total) by mouth daily., Disp: 30 tablet, Rfl: 11  Review of Systems  Constitutional: Negative for activity change, appetite change, chills, diaphoresis, fatigue, fever and unexpected weight change.  Eyes: Negative for visual disturbance.  Cardiovascular: Negative for chest pain, palpitations and leg swelling.  Endocrine: Positive for polyuria. Negative for polydipsia and polyphagia.    Social History   Tobacco Use  . Smoking status: Current Every Day Smoker    Packs/day: 1.00    Years: 15.00    Pack years: 15.00    Types: Cigarettes  . Smokeless tobacco: Never Used  . Tobacco comment: started smoking at age 52; has quit for 15 years but is currently smoking. Smoking 1 PPD  Substance Use Topics  . Alcohol use: Yes    Comment: Rarely   Objective:   BP 112/84 (BP Location: Left Arm, Patient Position: Sitting, Cuff Size: Large)   Pulse 98   Temp 98 F (36.7 C) (Oral)   Resp 16   Wt 222 lb (100.7 kg)   SpO2 96%   BMI 31.85 kg/m  Vitals:   03/29/18 1124  BP: 112/84  Pulse: 98  Resp: 16  Temp: 98 F (36.7 C)  TempSrc: Oral  SpO2: 96%  Weight: 222 lb (100.7 kg)     Physical Exam  Constitutional: He is oriented to  person, place, and time. He appears well-developed and well-nourished. No distress.  HENT:  Head: Normocephalic and atraumatic.  Eyes: Conjunctivae are normal. No scleral icterus.  Neck: Neck supple. No thyromegaly present.  Cardiovascular: Normal rate, regular rhythm, normal heart sounds and intact distal pulses.  No murmur heard. Pulmonary/Chest: Effort normal and breath sounds normal. No respiratory distress. He has no wheezes. He has no rales.  Musculoskeletal: He exhibits no edema or deformity.  Lymphadenopathy:    He has no cervical adenopathy.  Neurological: He is alert and oriented to person, place, and time.  Skin: Skin is warm and dry. Capillary refill takes less than 2 seconds. No rash noted.    Psychiatric: He has a normal mood and affect. His behavior is normal.  Vitals reviewed.   Results for orders placed or performed in visit on 03/29/18  POCT glycosylated hemoglobin (Hb A1C)  Result Value Ref Range   Hemoglobin A1C 9.6    Est. average glucose Bld gHb Est-mCnc 229        Assessment & Plan:   Problem List Items Addressed This Visit      Endocrine   Diabetes mellitus without complication ([place]) - Primary    Uncontrolled A1c has elevated from 6.9-9.6 in the last 6 months Encourage patient to resume physical activity and encouraged low-carb diet Continue his current metformin dose Referral to ophthalmology for eye exam Up-to-date on microalbumin and foot exam Tetanus vaccine given today Up-to-date on Pneumovax Start Jardiance 10 mg daily Follow-up in 6 weeks and consider dose titration if tolerating well Recheck A1c in 3 months      Relevant Medications   empagliflozin (JARDIANCE) 10 MG TABS tablet   Other Relevant Orders   POCT glycosylated hemoglobin (Hb A1C) (Completed)   Ambulatory referral to Ophthalmology    Other Visit Diagnoses    Need for Tdap vaccination       Relevant Orders   Tdap vaccine greater than or equal to 7yo IM (Completed)       Return in about 6 weeks (around 05/10/2018) for DM, HLD f/u.   The entirety of the information documented in the History of Present Illness, Review of Systems and Physical Exam were personally obtained by me. Portions of this information were initially documented by Raquel Sarna Ratchford, CMA and reviewed by me for thoroughness and accuracy.    Virginia Crews, MD, MPH [facility] 03/29/2018 12:01 PM

## 2018-03-29 NOTE — Patient Instructions (Signed)

## 2018-03-29 NOTE — Telephone Encounter (Signed)
-----   Message from Nori Riis, PA-C sent at 03/29/2018 11:21 AM EDT ----- Please let DJ know that his PSA has returned to baseline at 2.4.  I would suggest he go ahead and restart the Clomid, but he have his PSA rechecked in 3 months.

## 2018-03-29 NOTE — Assessment & Plan Note (Signed)
Uncontrolled A1c has elevated from 6.9-9.6 in the last 6 months Encourage patient to resume physical activity and encouraged low-carb diet Continue his current metformin dose Referral to ophthalmology for eye exam Up-to-date on microalbumin and foot exam Tetanus vaccine given today Up-to-date on Pneumovax Start Jardiance 10 mg daily Follow-up in 6 weeks and consider dose titration if tolerating well Recheck A1c in 3 months

## 2018-04-25 ENCOUNTER — Encounter: Payer: Self-pay | Admitting: Family Medicine

## 2018-04-25 NOTE — Telephone Encounter (Signed)
This severe chest pressure with nausea is concerning for possible MI or angina.  I recommend that the patient go to the emergency room to be evaluated more for this.  Please call him and advised him of this.  It is okay to hold the medicine and not resume it at this time.  Virginia Crews, MD, MPH [facility] 04/25/2018 10:11 AM

## 2018-05-10 ENCOUNTER — Ambulatory Visit: Payer: Self-pay | Admitting: Family Medicine

## 2018-05-11 ENCOUNTER — Encounter: Payer: Self-pay | Admitting: Family Medicine

## 2018-05-11 ENCOUNTER — Ambulatory Visit (INDEPENDENT_AMBULATORY_CARE_PROVIDER_SITE_OTHER): Payer: BLUE CROSS/BLUE SHIELD | Admitting: Family Medicine

## 2018-05-11 VITALS — BP 120/80 | HR 92 | Resp 16 | Wt 220.0 lb

## 2018-05-11 DIAGNOSIS — E781 Pure hyperglyceridemia: Secondary | ICD-10-CM

## 2018-05-11 DIAGNOSIS — R079 Chest pain, unspecified: Secondary | ICD-10-CM

## 2018-05-11 DIAGNOSIS — K219 Gastro-esophageal reflux disease without esophagitis: Secondary | ICD-10-CM | POA: Diagnosis not present

## 2018-05-11 DIAGNOSIS — E119 Type 2 diabetes mellitus without complications: Secondary | ICD-10-CM | POA: Diagnosis not present

## 2018-05-11 DIAGNOSIS — Z720 Tobacco use: Secondary | ICD-10-CM

## 2018-05-11 MED ORDER — BUPROPION HCL ER (XL) 150 MG PO TB24: 150.0000 mg | ORAL_TABLET | Freq: Every day | ORAL | 2 refills | Status: DC

## 2018-05-11 NOTE — Assessment & Plan Note (Signed)
Uncontrolled Last A1c elevated to 9.6 Again encouraged patient about physical activity and low-carb diet Continue current metformin dose Resume Jardiance Advised to reschedule his ophthalmology appointment Up-to-date on microalbumin and foot exam and vaccinations Follow-up in 6 weeks and recheck A1c Can consider further dose titration of Jardiance if he tolerates it well and A1c remains elevated

## 2018-05-11 NOTE — Assessment & Plan Note (Signed)
Patient is previously been well-controlled on daily PPI therapy It is possible that some of his chest pain symptoms are related to GERD, but that does not seem to be the entire picture After cardiac evaluation, if he continues to have symptoms, could consider GI referral for possible EGD

## 2018-05-11 NOTE — Assessment & Plan Note (Signed)
Doing well on Crestor daily without side effects Recheck CMP and lipid panel when fasting If triglycerides are significantly elevated, could consider additional therapy

## 2018-05-11 NOTE — Assessment & Plan Note (Signed)
3 to 5-minute discussion on the importance of cessation and consequences of long-term tobacco use Discussed medication options to help with cessation including nicotine replacement therapy, Wellbutrin, and Chantix Patient is not interested in Chantix due to possible side effects He does agree to try Wellbutrin We will start with 150 mg XL daily Advised to pick a quit date at least 7 days from start of medication At follow-up in 6 weeks, if he continues to smoke, but is tolerating Wellbutrin well, consider dose titration at 300 mg daily Counseled about possible side effects including dry mouth and insomnia

## 2018-05-11 NOTE — Assessment & Plan Note (Signed)
Patient without current chest pain, so does not need emergent evaluation for this today Blood pressure is well controlled, but patient does have risk factors for cardiac disease including diabetes, tobacco use, and hyperlipidemia EKG is reassuring today His story is concerning for possible cardiac etiology, especially given the associated symptoms and description of the pain, as well as when it comes on with exertion Urgent referral to cardiology Long discussion with the patient about when to seek emergency care prior to this cardiology appointment Continue Crestor, diabetes treatment, and daily aspirin

## 2018-05-11 NOTE — Patient Instructions (Signed)
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
## 2018-05-14 DIAGNOSIS — R079 Chest pain, unspecified: Secondary | ICD-10-CM | POA: Diagnosis not present

## 2018-05-14 DIAGNOSIS — E781 Pure hyperglyceridemia: Secondary | ICD-10-CM | POA: Diagnosis not present

## 2018-05-14 DIAGNOSIS — E119 Type 2 diabetes mellitus without complications: Secondary | ICD-10-CM | POA: Diagnosis not present

## 2018-05-15 ENCOUNTER — Telehealth: Payer: Self-pay

## 2018-05-15 DIAGNOSIS — J42 Unspecified chronic bronchitis: Secondary | ICD-10-CM | POA: Diagnosis not present

## 2018-05-15 DIAGNOSIS — R079 Chest pain, unspecified: Secondary | ICD-10-CM | POA: Diagnosis not present

## 2018-05-15 DIAGNOSIS — E7800 Pure hypercholesterolemia, unspecified: Secondary | ICD-10-CM | POA: Diagnosis not present

## 2018-05-15 DIAGNOSIS — Z720 Tobacco use: Secondary | ICD-10-CM | POA: Diagnosis not present

## 2018-05-15 LAB — COMPREHENSIVE METABOLIC PANEL
ALBUMIN: 4.9 g/dL (ref 3.5–5.5)
ALT: 32 IU/L (ref 0–44)
AST: 20 IU/L (ref 0–40)
Albumin/Globulin Ratio: 1.8 (ref 1.2–2.2)
Alkaline Phosphatase: 71 IU/L (ref 39–117)
BUN / CREAT RATIO: 13 (ref 9–20)
BUN: 16 mg/dL (ref 6–24)
Bilirubin Total: 0.3 mg/dL (ref 0.0–1.2)
CALCIUM: 9.5 mg/dL (ref 8.7–10.2)
CO2: 20 mmol/L (ref 20–29)
Chloride: 102 mmol/L (ref 96–106)
Creatinine, Ser: 1.19 mg/dL (ref 0.76–1.27)
GFR, EST AFRICAN AMERICAN: 85 mL/min/{1.73_m2} (ref >59)
GFR, EST NON AFRICAN AMERICAN: 74 mL/min/{1.73_m2} (ref >59)
GLOBULIN, TOTAL: 2.7 g/dL (ref 1.5–4.5)
Glucose: 157 mg/dL — ABNORMAL HIGH (ref 65–99)
Potassium: 4.5 mmol/L (ref 3.5–5.2)
SODIUM: 144 mmol/L (ref 134–144)
TOTAL PROTEIN: 7.6 g/dL (ref 6.0–8.5)

## 2018-05-15 LAB — LIPID PANEL
CHOL/HDL RATIO: 6.3 ratio — AB (ref 0.0–5.0)
Cholesterol, Total: 170 mg/dL (ref 100–199)
HDL: 27 mg/dL — ABNORMAL LOW (ref >39)
LDL CALC: 67 mg/dL (ref 0–99)
TRIGLYCERIDES: 381 mg/dL — AB (ref 0–149)
VLDL Cholesterol Cal: 76 mg/dL — ABNORMAL HIGH (ref 5–40)

## 2018-05-15 NOTE — Telephone Encounter (Signed)
-----   Message from Virginia Crews, MD sent at 05/15/2018  9:12 AM EDT ----- Normal kidney function, liver function, electrolytes.  Kidney function is slightly worse than previously, htough, so stay well hydrated and do not take NSAIDs chronically.  Cholesterol is stable.  Triglycerides remain quite high.  We don't recommend treatment specifically for triglycerides until they are >500.  Low carb diet (also good for your diabetes) and regular exercise are important to lower triglycerides.  Virginia Crews, MD, MPH [facility] 05/15/2018 9:12 AM

## 2018-05-15 NOTE — Telephone Encounter (Signed)
Pt advised.

## 2018-05-22 DIAGNOSIS — R079 Chest pain, unspecified: Secondary | ICD-10-CM | POA: Diagnosis not present

## 2018-05-28 ENCOUNTER — Ambulatory Visit: Payer: BLUE CROSS/BLUE SHIELD | Admitting: Family Medicine

## 2018-05-28 ENCOUNTER — Encounter: Payer: Self-pay | Admitting: Family Medicine

## 2018-05-28 VITALS — BP 128/86 | HR 92 | Temp 98.2°F | Resp 16 | Wt 213.0 lb

## 2018-05-28 DIAGNOSIS — R079 Chest pain, unspecified: Secondary | ICD-10-CM | POA: Diagnosis not present

## 2018-05-28 DIAGNOSIS — B86 Scabies: Secondary | ICD-10-CM

## 2018-05-28 DIAGNOSIS — E7800 Pure hypercholesterolemia, unspecified: Secondary | ICD-10-CM | POA: Diagnosis not present

## 2018-05-28 DIAGNOSIS — E119 Type 2 diabetes mellitus without complications: Secondary | ICD-10-CM | POA: Diagnosis not present

## 2018-05-28 DIAGNOSIS — K449 Diaphragmatic hernia without obstruction or gangrene: Secondary | ICD-10-CM | POA: Diagnosis not present

## 2018-05-28 DIAGNOSIS — K219 Gastro-esophageal reflux disease without esophagitis: Secondary | ICD-10-CM

## 2018-05-28 MED ORDER — PERMETHRIN 5 % EX CREA: 1.0000 "application " | TOPICAL_CREAM | Freq: Once | CUTANEOUS | 0 refills | Status: AC

## 2018-05-28 NOTE — Progress Notes (Signed)
Patient: [facility]. Male    DOB: Jan 30, 1971   47 y.o.   MRN: [id number] Visit Date: 05/28/2018  Today's Provider: Lavon Paganini, MD   Chief Complaint  Patient presents with  . Rash    First noticed it about two weeks ago.   Subjective:    Rash  This is a new problem. The current episode started 1 to 4 weeks ago. The problem has been gradually worsening since onset. The rash is diffuse. He was exposed to a new medication (Recently started Wellbutrin& Jardiance). Pertinent negatives include no anorexia, congestion, cough, diarrhea, facial edema, fatigue, fever, joint pain, nail changes, rhinorrhea, shortness of breath, sore throat or vomiting. Past treatments include nothing.    Patient was recently seen for chest pain.  He states that cardiology saw him today and told him that his heart was fine.  He continues to have pain and thinks it may be related to gERD and hiatal hernia.  He is taking omeprazole at least once and sometimes twice daily which helps somewhat.  He does not notice any difference when he eats differently.   No Known Allergies   Current Outpatient Medications:  .  aspirin 81 MG chewable tablet, Chew 81 mg by mouth daily., Disp: , Rfl:  .  buPROPion (WELLBUTRIN XL) 150 MG 24 hr tablet, Take 1 tablet (150 mg total) by mouth daily., Disp: 30 tablet, Rfl: 2 .  empagliflozin (JARDIANCE) 10 MG TABS tablet, Take 10 mg by mouth daily., Disp: 30 tablet, Rfl: 2 .  omeprazole (PRILOSEC) 20 MG capsule, Take 20 mg by mouth daily., Disp: , Rfl:  .  polyethylene glycol powder (GLYCOLAX/MIRALAX) powder, Take 17 g by mouth 2 (two) times daily., Disp: 3350 g, Rfl: 3 .  rosuvastatin (CRESTOR) 40 MG tablet, Take 1 tablet (40 mg total) by mouth daily., Disp: 30 tablet, Rfl: 11 .  metFORMIN (GLUCOPHAGE-XR) 500 MG 24 hr tablet, Take 1 tablet by mouth 2 (two) times daily., Disp: , Rfl:   Review of Systems  Constitutional: Negative.  Negative for fatigue and fever.  HENT:  Negative for congestion, rhinorrhea and sore throat.   Eyes: Positive for itching. Negative for photophobia, discharge, redness and visual disturbance.  Respiratory: Negative.  Negative for cough and shortness of breath.   Gastrointestinal: Negative.  Negative for anorexia, diarrhea and vomiting.  Musculoskeletal: Negative for joint pain.  Skin: Positive for rash. Negative for nail changes.  Neurological: Negative for dizziness, light-headedness and headaches.    Social History   Tobacco Use  . Smoking status: Current Every Day Smoker    Packs/day: 1.00    Years: 15.00    Pack years: 15.00    Types: Cigarettes  . Smokeless tobacco: Never Used  . Tobacco comment: started smoking at age 89; has quit for 15 years but is currently smoking. Smoking 1 PPD  Substance Use Topics  . Alcohol use: Yes    Comment: Rarely   Objective:   BP 128/86 (BP Location: Right Arm, Patient Position: Sitting, Cuff Size: Normal)   Pulse 92   Temp 98.2 F (36.8 C) (Oral)   Resp 16   Wt 213 lb (96.6 kg)   BMI 30.56 kg/m  Vitals:   05/28/18 1345  BP: 128/86  Pulse: 92  Resp: 16  Temp: 98.2 F (36.8 C)  TempSrc: Oral  Weight: 213 lb (96.6 kg)     Physical Exam  Constitutional: He is oriented to person, place, and time. He  appears well-developed and well-nourished. No distress.  HENT:  Head: Normocephalic and atraumatic.  Eyes: Conjunctivae are normal. Right eye exhibits no discharge. Left eye exhibits no discharge. No scleral icterus.  Neck: Neck supple. No thyromegaly present.  Cardiovascular: Normal rate and regular rhythm.  Pulmonary/Chest: Effort normal. No respiratory distress.  Musculoskeletal: He exhibits no edema.  Lymphadenopathy:    He has no cervical adenopathy.  Neurological: He is alert and oriented to person, place, and time.  Skin: Skin is warm and dry. Capillary refill takes less than 2 seconds. Rash noted.  Erythematous papules diffusely over chest, abdomen, back,  webspaces of fingers, and thighs.  Psychiatric: He has a normal mood and affect. His behavior is normal.  Vitals reviewed.      Assessment & Plan:   1. Scabies - rash c/w scabies - discussed washing all towels, linens, bedding, etc in hot water - discussed treatment of him and household contacts with permethrin cream - return precautions discussed  2. Gastroesophageal reflux disease, esophagitis presence not specified 3. Hiatal hernia - continue PPI - may need EGD or other eval given that PPI therapy is not controlling symptoms - Ambulatory referral to Gastroenterology    Meds ordered this encounter  Medications  . permethrin (ELIMITE) 5 % cream    Sig: Apply 1 application topically once for 1 dose.    Dispense:  60 g    Refill:  0     Return if symptoms worsen or fail to improve.   The entirety of the information documented in the History of Present Illness, Review of Systems and Physical Exam were personally obtained by me. Portions of this information were initially documented by Ashley Royalty, CMA and reviewed by me for thoroughness and accuracy.    Virginia Crews, MD, MPH [facility] 05/28/2018 2:15 PM

## 2018-05-28 NOTE — Patient Instructions (Signed)
Scabies, Adult Scabies is a skin condition that happens when very small insects get under the skin (infestation). This causes a rash and severe itchiness. Scabies can spread from person to person (is contagious). If you get scabies, it is common for others in your household to get scabies too. With proper treatment, symptoms usually go away in 2-4 weeks. Scabies usually does not cause lasting problems. What are the causes? This condition is caused by mites (Sarcoptes scabiei, or human itch mites) that can only be seen with a microscope. The mites get into the top layer of skin and lay eggs. Scabies can spread from person to person through:  Close contact with a person who has scabies.  Contact with infested items, such as towels, bedding, or clothing.  What increases the risk? This condition is more likely to develop in:  People who live in nursing homes and other extended-care facilities.  People who have sexual contact with a partner who has scabies.  Young children who attend child care facilities.  People who care for others who are at increased risk for scabies.  What are the signs or symptoms? Symptoms of this condition may include:  Severe itchiness. This is often worse at night.  A rash that includes tiny red bumps or blisters. The rash commonly occurs on the wrist, elbow, armpit, fingers, waist, groin, or buttocks. Bumps may form a line (burrow) in some areas.  Skin irritation. This can include scaly patches or sores.  How is this diagnosed? This condition is diagnosed with a physical exam. Your health care provider will look closely at your skin. In some cases, your health care provider may take a sample of your affected skin (skin scraping) and have it examined under a microscope. How is this treated? This condition may be treated with:  Medicated cream or lotion that kills the mites. This is spread on the entire body and left on for several hours. Usually, one treatment  with medicated cream or lotion is enough to kill all of the mites. In severe cases, the treatment may be repeated.  Medicated cream that relieves itching.  Medicines that help to relieve itching.  Medicines that kill the mites. This treatment is rarely used.  Follow these instructions at home:  Medicines  Take or apply over-the-counter and prescription medicines as told by your health care provider.  Apply medicated cream or lotion as told by your health care provider.  Do not wash off the medicated cream or lotion until the necessary amount of time has passed. Skin Care  Avoid scratching your affected skin.  Keep your fingernails closely trimmed to reduce injury from scratching.  Take cool baths or apply cool washcloths to help reduce itching. General instructions  Clean all items that you recently had contact with, including bedding, clothing, and furniture. Do this on the same day that your treatment starts. ? Use hot water when you wash items. ? Place unwashable items into closed, airtight plastic bags for at least 3 days. The mites cannot live for more than 3 days away from human skin. ? Vacuum furniture and mattresses that you use.  Make sure that other people who may have been infested are examined by a health care provider. These include members of your household and anyone who may have had contact with infested items.  Keep all follow-up visits as told by your health care provider. This is important. Contact a health care provider if:  You have itching that does not go away   after 4 weeks of treatment.  You continue to develop new bumps or burrows.  You have redness, swelling, or pain in your rash area after treatment.  You have fluid, blood, or pus coming from your rash. This information is not intended to replace advice given to you by your health care provider. Make sure you discuss any questions you have with your health care provider. Document Released:  08/12/2015 Document Revised: 04/28/2016 Document Reviewed: 06/23/2015 Elsevier Interactive Patient Education  2018 Elsevier Inc.  

## 2018-06-22 ENCOUNTER — Ambulatory Visit: Payer: BLUE CROSS/BLUE SHIELD | Admitting: Family Medicine

## 2018-06-26 ENCOUNTER — Ambulatory Visit: Payer: BLUE CROSS/BLUE SHIELD | Admitting: Gastroenterology

## 2018-06-26 ENCOUNTER — Encounter: Payer: Self-pay | Admitting: Gastroenterology

## 2018-06-26 ENCOUNTER — Other Ambulatory Visit: Payer: Self-pay | Admitting: Family Medicine

## 2018-06-26 VITALS — BP 114/76 | HR 97 | Ht 70.0 in | Wt 218.6 lb

## 2018-06-26 DIAGNOSIS — K219 Gastro-esophageal reflux disease without esophagitis: Secondary | ICD-10-CM

## 2018-06-26 DIAGNOSIS — R972 Elevated prostate specific antigen [PSA]: Secondary | ICD-10-CM

## 2018-06-26 NOTE — Patient Instructions (Signed)
F/U 3 months Bed wedge  Gastroesophageal Reflux Disease, Adult Normally, food travels down the esophagus and stays in the stomach to be digested. If a person has gastroesophageal reflux disease (GERD), food and stomach acid move back up into the esophagus. When this happens, the esophagus becomes sore and swollen (inflamed). Over time, GERD can make small holes (ulcers) in the lining of the esophagus. Follow these instructions at home: Diet  Follow a diet as told by your doctor. You may need to avoid foods and drinks such as: ? Coffee and tea (with or without caffeine). ? Drinks that contain alcohol. ? Energy drinks and sports drinks. ? Carbonated drinks or sodas. ? Chocolate and cocoa. ? Peppermint and mint flavorings. ? Garlic and onions. ? Horseradish. ? Spicy and acidic foods, such as peppers, chili powder, curry powder, vinegar, hot sauces, and BBQ sauce. ? Citrus fruit juices and citrus fruits, such as oranges, lemons, and limes. ? Tomato-based foods, such as red sauce, chili, salsa, and pizza with red sauce. ? Fried and fatty foods, such as donuts, french fries, potato chips, and high-fat dressings. ? High-fat meats, such as hot dogs, rib eye steak, sausage, ham, and bacon. ? High-fat dairy items, such as whole milk, butter, and cream cheese.  Eat small meals often. Avoid eating large meals.  Avoid drinking large amounts of liquid with your meals.  Avoid eating meals during the 2-3 hours before bedtime.  Avoid lying down right after you eat.  Do not exercise right after you eat. General instructions  Pay attention to any changes in your symptoms.  Take over-the-counter and prescription medicines only as told by your doctor. Do not take aspirin, ibuprofen, or other NSAIDs unless your doctor says it is okay.  Do not use any tobacco products, including cigarettes, chewing tobacco, and e-cigarettes. If you need help quitting, ask your doctor.  Wear loose clothes. Do not  wear anything tight around your waist.  Raise (elevate) the head of your bed about 6 inches (15 cm).  Try to lower your stress. If you need help doing this, ask your doctor.  If you are overweight, lose an amount of weight that is healthy for you. Ask your doctor about a safe weight loss goal.  Keep all follow-up visits as told by your doctor. This is important. Contact a doctor if:  You have new symptoms.  You lose weight and you do not know why it is happening.  You have trouble swallowing, or it hurts to swallow.  You have wheezing or a cough that keeps happening.  Your symptoms do not get better with treatment.  You have a hoarse voice. Get help right away if:  You have pain in your arms, neck, jaw, teeth, or back.  You feel sweaty, dizzy, or light-headed.  You have chest pain or shortness of breath.  You throw up (vomit) and your throw up looks like blood or coffee grounds.  You pass out (faint).  Your poop (stool) is bloody or black.  You cannot swallow, drink, or eat. This information is not intended to replace advice given to you by your health care provider. Make sure you discuss any questions you have with your health care provider. Document Released: 05/09/2008 Document Revised: 04/28/2016 Document Reviewed: 03/18/2015 Elsevier Interactive Patient Education  [employer].

## 2018-06-27 ENCOUNTER — Ambulatory Visit: Payer: BLUE CROSS/BLUE SHIELD | Admitting: Family Medicine

## 2018-06-27 ENCOUNTER — Encounter: Payer: Self-pay | Admitting: Family Medicine

## 2018-06-27 VITALS — BP 120/84 | HR 84 | Temp 98.1°F | Resp 16 | Wt 219.0 lb

## 2018-06-27 DIAGNOSIS — E119 Type 2 diabetes mellitus without complications: Secondary | ICD-10-CM

## 2018-06-27 DIAGNOSIS — Z720 Tobacco use: Secondary | ICD-10-CM | POA: Diagnosis not present

## 2018-06-27 LAB — POCT GLYCOSYLATED HEMOGLOBIN (HGB A1C)
Est. average glucose Bld gHb Est-mCnc: 186
Hemoglobin A1C: 8.1 % — AB (ref 4.0–5.6)

## 2018-06-27 MED ORDER — EMPAGLIFLOZIN 25 MG PO TABS: 25.0000 mg | ORAL_TABLET | Freq: Every day | ORAL | 5 refills | Status: DC

## 2018-06-27 MED ORDER — BUPROPION HCL ER (XL) 300 MG PO TB24: 300.0000 mg | ORAL_TABLET | Freq: Every day | ORAL | 5 refills | Status: DC

## 2018-06-27 NOTE — Progress Notes (Signed)
Patient: Ethan Fox. Male    DOB: 18-Mar-1971   47 y.o.   MRN: [id number] Visit Date: 06/27/2018  Today's Provider: Lavon Paganini, MD   I, Martha Clan, CMA, am acting as scribe for Lavon Paganini, MD.  Chief Complaint  Patient presents with  . Diabetes  . Nicotine Dependence   Subjective:    HPI      Diabetes Mellitus Type II, Follow-up:   Lab Results  Component Value Date   HGBA1C 9.6 03/29/2018   HGBA1C 6.9 09/28/2017   HGBA1C 8.3 05/04/2017    Last seen for diabetes 6 weeks ago.  Management since then includes resume Jardiance and continue metformin. Work on low [employer]. He reports good compliance with treatment. He is not having side effects. Other than polyuria due to Jardiance. Current symptoms include polydipsia and polyuria and have been stable. Home blood sugar records: ran out of test strips this week. When he does check FBS, it is rarely below 165.  Episodes of hypoglycemia? no   Current Insulin Regimen: N/A Most Recent Eye Exam: could not make eye exam appointment. 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 Changes made at last visit include starting Wellbutrin.  He reports good compliance with treatment. He feels that condition is Improved.  Cravings are improving. He is not having side effects.  Is  smoking 0.5 PPD, which has decreased from 1 PPD.  States that he is smoking with fiance because he feels bad that she is smoking alone. ------------------------------------------------------------------------------------    No Known Allergies   Current Outpatient Medications:  .  aspirin 81 MG chewable tablet, Chew 81 mg by mouth daily., Disp: , Rfl:  .  buPROPion (WELLBUTRIN XL) 150 MG 24 hr tablet, Take 1 tablet (150 mg total) by mouth daily., Disp: 30 tablet, Rfl: 2 .  CINNAMON PO, Take by mouth., Disp: , Rfl:  .  empagliflozin (JARDIANCE) 10 MG TABS tablet, Take 10 mg by mouth daily., Disp: 30 tablet, Rfl: 2 .  metFORMIN (GLUCOPHAGE-XR) 500 MG 24 hr tablet, Take 1 tablet by mouth 2 (two) times daily., Disp: , Rfl:  .  omeprazole (PRILOSEC) 20 MG capsule, Take 20 mg by mouth daily., Disp: , Rfl:  .  polyethylene glycol powder (GLYCOLAX/MIRALAX) powder, Take 17 g by mouth 2 (two) times daily., Disp: 3350 g, Rfl: 3 .  rosuvastatin (CRESTOR) 40 MG tablet, Take 1 tablet (40 mg total) by mouth daily., Disp: 30 tablet, Rfl: 11  Review of Systems  Constitutional: Negative for activity change, appetite change, chills, diaphoresis, fatigue, fever and unexpected weight change.  Eyes: Negative for visual disturbance.  Respiratory: Negative for shortness of breath.   Cardiovascular: Negative for chest pain, palpitations and leg swelling.  Endocrine: Positive for polydipsia and polyuria. Negative for polyphagia.    Social  History   Tobacco Use  . Smoking status: Current Every Day Smoker    Packs/day: 1.00    Years: 15.00    Pack years: 15.00    Types: Cigarettes  . Smokeless tobacco: Never Used  . Tobacco comment: started smoking at age 26; has quit for 15 years but is currently smoking. Has decreased cigarette use from 1 PPD to 0.5 PPD since starting Wellbutrin (06/27/2018)  Substance Use Topics  . Alcohol use: Yes    Comment: Rarely   Objective:   BP 120/84 (BP Location: Left Arm,  Patient Position: Sitting, Cuff Size: Large)   Pulse 84   Temp 98.1 F (36.7 C) (Oral)   Resp 16   Wt 219 lb (99.3 kg)   SpO2 99%   BMI 31.42 kg/m  Vitals:   06/27/18 0806  BP: 120/84  Pulse: 84  Resp: 16  Temp: 98.1 F (36.7 C)  TempSrc: Oral  SpO2: 99%  Weight: 219 lb (99.3 kg)     Physical Exam  Constitutional: He is oriented to person, place, and time. He appears well-developed and well-nourished. No distress.  HENT:  Head: Normocephalic and atraumatic.  Eyes: Conjunctivae are normal. No scleral icterus.  Neck: Neck supple. No thyromegaly present.  Cardiovascular: Normal rate, regular rhythm, normal heart sounds and intact distal pulses.  No murmur heard. Pulmonary/Chest: Effort normal and breath sounds normal. No respiratory distress. He has no wheezes. He has no rales.  Musculoskeletal: He exhibits no edema or deformity.  Lymphadenopathy:    He has no cervical adenopathy.  Neurological: He is alert and oriented to person, place, and time.  Skin: Skin is warm and dry. Capillary refill takes less than 2 seconds. No rash noted.  Psychiatric: He has a normal mood and affect. His behavior is normal.  Vitals reviewed.   Results for orders placed or performed in visit on 06/27/18  POCT glycosylated hemoglobin (Hb A1C)  Result Value Ref Range   Hemoglobin A1C 8.1 (A) 4.0 - 5.6 %   Est. average glucose Bld gHb Est-mCnc 186        Assessment & Plan:     Problem List Items Addressed This Visit      Endocrine   Diabetes mellitus without complication ([place]) - Primary    Uncontrolled, but improving A1c has decreased from 9.6-8.1 Again encouraged patient about physical activity and low-carb diet Continue metformin dose-could consider increasing to 1000 mg twice daily and next visit Increase Jardiance from 10 mg to 25 mg daily Advised to reschedule his ophthalmology appointment Up-to-date on microalbumin, foot exam, vaccinations Follow-up in 3 months and recheck A1c       Relevant Medications   empagliflozin (JARDIANCE) 25 MG TABS tablet   Other Relevant Orders   POCT glycosylated hemoglobin (Hb A1C) (Completed)     Other   Tobacco abuse    Improving Doing well on Wellbutrin with decreased cravings Increase Wellbutrin to 300 mg XL daily Follow-up in 3 months or sooner as needed         Return in about 3 months (around 09/27/2018) for Diabetes f/u.   The entirety of the information documented in the History of Present Illness, Review of Systems and Physical Exam were personally obtained by me. Portions of this information were initially documented by Raquel Sarna Ratchford, CMA and reviewed by me for thoroughness and accuracy.    Virginia Crews, MD, MPH [facility] 06/27/2018 9:12 AM

## 2018-06-27 NOTE — Assessment & Plan Note (Signed)
Improving Doing well on Wellbutrin with decreased cravings Increase Wellbutrin to 300 mg XL daily Follow-up in 3 months or sooner as needed

## 2018-06-27 NOTE — Patient Instructions (Signed)
Increase Jardiance to 25mg  daily (2 1/2 of 10mg  pills) Increase Wellbutrin to 300mg  daily (2 of the 150mg  pills)

## 2018-06-27 NOTE — Assessment & Plan Note (Signed)
Uncontrolled, but improving A1c has decreased from 9.6-8.1 Again encouraged patient about physical activity and low-carb diet Continue metformin dose-could consider increasing to 1000 mg twice daily and next visit Increase Jardiance from 10 mg to 25 mg daily Advised to reschedule his ophthalmology appointment Up-to-date on microalbumin, foot exam, vaccinations Follow-up in 3 months and recheck A1c

## 2018-06-28 ENCOUNTER — Other Ambulatory Visit: Payer: BLUE CROSS/BLUE SHIELD

## 2018-06-28 DIAGNOSIS — R972 Elevated prostate specific antigen [PSA]: Secondary | ICD-10-CM | POA: Diagnosis not present

## 2018-06-29 ENCOUNTER — Other Ambulatory Visit: Payer: Self-pay

## 2018-06-29 DIAGNOSIS — Z1211 Encounter for screening for malignant neoplasm of colon: Secondary | ICD-10-CM

## 2018-06-29 DIAGNOSIS — Z860100 Personal history of colon polyps, unspecified: Secondary | ICD-10-CM

## 2018-06-29 DIAGNOSIS — K219 Gastro-esophageal reflux disease without esophagitis: Secondary | ICD-10-CM

## 2018-06-29 LAB — PSA: Prostate Specific Ag, Serum: 2.1 ng/mL (ref 0.0–4.0)

## 2018-06-29 MED ORDER — BISACODYL 5 MG PO TBEC: 10.0000 mg | DELAYED_RELEASE_TABLET | Freq: Once | ORAL | 0 refills | Status: AC

## 2018-06-29 MED ORDER — PEG 3350-KCL-NA BICARB-NACL 420 G PO SOLR: 4000.0000 mL | Freq: Once | ORAL | 0 refills | Status: AC

## 2018-07-02 NOTE — Progress Notes (Signed)
Ethan Fox  Ethan Fox, Ethan Fox  Main: [phone]  Fax: [phone]   Gastroenterology Consultation  Referring Provider:     Virginia Crews, MD Primary Care Physician:  Virginia Crews, MD Primary Gastroenterologist:  Dr. Vonda Antigua Reason for Consultation:     GERD        HPI:    Chief Complaint  Patient presents with  . Gastroesophageal Reflux    referral from Dr. Lavon Paganini, MD  . Hiatal Hernia    [facility]. is a 47 y.o. y/o male referred for consultation & management  by Dr. Brita Romp, Dionne Bucy, MD.  Presents for evaluation of heartburn and chest pain occurring despite PPI use daily with normal stress echo done by cardiology.  Symptoms occur 3-4 times a week.  No dysphagia.  No nausea or vomiting.  No weight loss.  No family history of colon cancer.  Tries not eat 3 hours before bedtime.  No previous history of EGDs.  Past Medical History:  Diagnosis Date  . Constipation   . Diabetes mellitus without complication ([place])   . GERD (gastroesophageal reflux disease)   . Hiatal hernia     Past Surgical History:  Procedure Laterality Date  . NO PAST SURGERIES      Prior to Admission medications   Medication Sig Start Date End Date Taking? Authorizing Provider  aspirin 81 MG chewable tablet Chew 81 mg by mouth daily.   Yes [provider]  omeprazole (PRILOSEC) 20 MG capsule Take 20 mg by mouth daily.   Yes [provider]  polyethylene glycol powder (GLYCOLAX/MIRALAX) powder Take 17 g by mouth 2 (two) times daily. 08/30/17  Yes Bacigalupo, Dionne Bucy, MD  rosuvastatin (CRESTOR) 40 MG tablet Take 1 tablet (40 mg total) by mouth daily. 02/02/18 02/02/19 Yes Bacigalupo, Dionne Bucy, MD  buPROPion (WELLBUTRIN XL) 300 MG 24 hr tablet Take 1 tablet (300 mg total) by mouth daily. 06/27/18   Virginia Crews, MD  CINNAMON PO Take by mouth.    [provider]  empagliflozin  (JARDIANCE) 25 MG TABS tablet Take 25 mg by mouth daily. 06/27/18   Virginia Crews, MD  metFORMIN (GLUCOPHAGE-XR) 500 MG 24 hr tablet Take 1 tablet by mouth 2 (two) times daily. 05/15/17 06/27/18  [provider]    Family History  Problem Relation Age of Onset  . COPD Mother 76       smoker  . Kidney disease Mother   . Diabetes Mother   . Pancreatic cancer Mother   . Diabetes Father   . Hypothyroidism Sister   . Heart disease Maternal Grandmother   . Heart disease Maternal Grandfather   . Heart disease Paternal Grandmother 2       died of MI  . Colon cancer Neg Hx   . Breast cancer Neg Hx   . Prostate cancer Neg Hx   . Kidney cancer Neg Hx   . Bladder Cancer Neg Hx      Social History   Tobacco Use  . Smoking status: Current Every Day Smoker    Packs/day: 1.00    Years: 15.00    Pack years: 15.00    Types: Cigarettes  . Smokeless tobacco: Never Used  . Tobacco comment: started smoking at age 51; has quit for 15 years but is currently smoking. Has decreased cigarette use from 1 PPD to 0.5 PPD since starting Wellbutrin (06/27/2018)  Substance Use Topics  .  Alcohol use: Yes    Comment: Rarely  . Drug use: No    Allergies as of 06/26/2018  . (No Known Allergies)    Review of Systems:    All systems reviewed and negative except where noted in HPI.   Physical Exam:  BP 114/76   Pulse 97   Ht 5\' 10"  (1.778 m)   Wt 218 lb 9.6 oz (99.2 kg)   BMI 31.37 kg/m  No LMP for male patient. Psych:  Alert and cooperative. Normal mood and affect. General:   Alert,  Well-developed, well-nourished, pleasant and cooperative in NAD Head:  Normocephalic and atraumatic. Eyes:  Sclera clear, no icterus.   Conjunctiva pink. Ears:  Normal auditory acuity. Nose:  No deformity, discharge, or lesions. Mouth:  No deformity or lesions,oropharynx pink & moist. Neck:  Supple; no masses or thyromegaly. Lungs:  Respirations even and unlabored.  Clear throughout to auscultation.    No wheezes, crackles, or rhonchi. No acute distress. Heart:  Regular rate and rhythm; no murmurs, clicks, rubs, or gallops. Abdomen:  Normal bowel sounds.  No bruits.  Soft, non-tender and non-distended without masses, hepatosplenomegaly or hernias noted.  No guarding or rebound tenderness.    Msk:  Symmetrical without gross deformities. Good, equal movement & strength bilaterally. Pulses:  Normal pulses noted. Extremities:  No clubbing or edema.  No cyanosis. Neurologic:  Alert and oriented x3;  grossly normal neurologically. Skin:  Intact without significant lesions or rashes. No jaundice. Lymph Nodes:  No significant cervical adenopathy. Psych:  Alert and cooperative. Normal mood and affect.   Labs: CBC    Component Value Date/Time   WBC 7.5 09/28/2017 1154   RBC 5.22 09/28/2017 1154   HGB 15.6 09/28/2017 1154   HCT 44.7 09/28/2017 1154   PLT 168 09/28/2017 1154   MCV 85.6 09/28/2017 1154   MCH 29.9 09/28/2017 1154   MCHC 34.9 09/28/2017 1154   RDW 12.9 09/28/2017 1154   LYMPHSABS 1,890 09/28/2017 1154   EOSABS 98 09/28/2017 1154   BASOSABS 38 09/28/2017 1154   CMP     Component Value Date/Time   NA 144 05/14/2018 0855   K 4.5 05/14/2018 0855   CL 102 05/14/2018 0855   CO2 20 05/14/2018 0855   GLUCOSE 157 (H) 05/14/2018 0855   GLUCOSE 110 (H) 09/28/2017 1154   BUN 16 05/14/2018 0855   CREATININE 1.19 05/14/2018 0855   CREATININE 0.84 09/28/2017 1154   CALCIUM 9.5 05/14/2018 0855   PROT 7.6 05/14/2018 0855   ALBUMIN 4.9 05/14/2018 0855   AST 20 05/14/2018 0855   ALT 32 05/14/2018 0855   ALKPHOS 71 05/14/2018 0855   BILITOT 0.3 05/14/2018 0855   GFRNONAA 74 05/14/2018 0855   GFRNONAA 106 09/28/2017 1154   GFRAA 85 05/14/2018 0855   GFRAA 123 09/28/2017 1154    Imaging Studies: No results found.  Assessment and Plan:   [facility]. is a 47 y.o. y/o male has been referred for evaluation for GERD  Ongoing symptoms despite PPI Evaluated by cardiology  and normal stress echo We will schedule for EGD to evaluate for any underlying Barrett's, and rule out any underlying lesions Patient educated extensively on acid reflux lifestyle modification, including buying a bed wedge, not eating 3 hrs before bedtime, diet modifications, and handout given for the same.    We will also schedule for screening colonoscopy along with EGD, as patient will be 47 years of age in August 2019, and [place] recommend  screening colonoscopy starting at 45 years of age  I have discussed alternative options, risks & benefits,  which include, but are not limited to, bleeding, infection, perforation,respiratory complication & drug reaction.  The patient agrees with this plan & written consent will be obtained.      Dr Ethan Antigua

## 2018-07-07 ENCOUNTER — Other Ambulatory Visit: Payer: Self-pay | Admitting: Family Medicine

## 2018-07-23 ENCOUNTER — Telehealth: Payer: Self-pay

## 2018-07-23 ENCOUNTER — Other Ambulatory Visit: Payer: Self-pay | Admitting: Family Medicine

## 2018-07-23 ENCOUNTER — Other Ambulatory Visit: Payer: BLUE CROSS/BLUE SHIELD

## 2018-07-23 DIAGNOSIS — E349 Endocrine disorder, unspecified: Secondary | ICD-10-CM | POA: Diagnosis not present

## 2018-07-23 NOTE — Telephone Encounter (Signed)
Pt's emergency contact Lodema Hong) calls with pt also next to her stating that he thought his procedure was Wednesday instead of Friday. Procedure was rescheduled to 08/01/2018 at [facility]. Penny at [facility] Endo unit notified.

## 2018-07-24 LAB — HEPATIC FUNCTION PANEL
ALT: 27 IU/L (ref 0–44)
AST: 19 IU/L (ref 0–40)
Albumin: 4.3 g/dL (ref 3.5–5.5)
Alkaline Phosphatase: 64 IU/L (ref 39–117)
BILIRUBIN, DIRECT: 0.1 mg/dL (ref 0.00–0.40)
Bilirubin Total: 0.2 mg/dL (ref 0.0–1.2)
TOTAL PROTEIN: 7 g/dL (ref 6.0–8.5)

## 2018-07-24 LAB — HEMOGLOBIN: Hemoglobin: 16.7 g/dL (ref 13.0–17.7)

## 2018-07-24 LAB — TESTOSTERONE: Testosterone: 333 ng/dL (ref 264–916)

## 2018-07-24 LAB — HEMATOCRIT: Hematocrit: 49.3 % (ref 37.5–51.0)

## 2018-07-30 ENCOUNTER — Other Ambulatory Visit: Payer: Self-pay | Admitting: Family Medicine

## 2018-08-01 ENCOUNTER — Encounter: Payer: Self-pay | Admitting: *Deleted

## 2018-08-01 ENCOUNTER — Ambulatory Visit
Admission: RE | Admit: 2018-08-01 | Discharge: 2018-08-01 | Disposition: A | Discharge status: Home / Self Care | Payer: BLUE CROSS/BLUE SHIELD | Source: Ambulatory Visit | Attending: Gastroenterology | Admitting: Gastroenterology

## 2018-08-01 ENCOUNTER — Ambulatory Visit: Payer: BLUE CROSS/BLUE SHIELD | Admitting: Anesthesiology

## 2018-08-01 ENCOUNTER — Encounter
Admission: RE | Disposition: A | Discharge status: Home / Self Care | Payer: Self-pay | Source: Ambulatory Visit | Attending: Gastroenterology

## 2018-08-01 DIAGNOSIS — Z860100 Personal history of colon polyps, unspecified: Secondary | ICD-10-CM

## 2018-08-01 DIAGNOSIS — K219 Gastro-esophageal reflux disease without esophagitis: Secondary | ICD-10-CM | POA: Diagnosis not present

## 2018-08-01 DIAGNOSIS — K635 Polyp of colon: Secondary | ICD-10-CM | POA: Diagnosis not present

## 2018-08-01 DIAGNOSIS — Z7982 Long term (current) use of aspirin: Secondary | ICD-10-CM | POA: Diagnosis not present

## 2018-08-01 DIAGNOSIS — K2100 Gastro-esophageal reflux disease with esophagitis, without bleeding: Secondary | ICD-10-CM

## 2018-08-01 DIAGNOSIS — Z79899 Other long term (current) drug therapy: Secondary | ICD-10-CM | POA: Diagnosis not present

## 2018-08-01 DIAGNOSIS — R1310 Dysphagia, unspecified: Secondary | ICD-10-CM

## 2018-08-01 DIAGNOSIS — K2930 Chronic superficial gastritis without bleeding: Secondary | ICD-10-CM | POA: Insufficient documentation

## 2018-08-01 DIAGNOSIS — D126 Benign neoplasm of colon, unspecified: Secondary | ICD-10-CM | POA: Diagnosis not present

## 2018-08-01 DIAGNOSIS — D123 Benign neoplasm of transverse colon: Secondary | ICD-10-CM | POA: Diagnosis not present

## 2018-08-01 DIAGNOSIS — K3189 Other diseases of stomach and duodenum: Secondary | ICD-10-CM | POA: Diagnosis not present

## 2018-08-01 DIAGNOSIS — E119 Type 2 diabetes mellitus without complications: Secondary | ICD-10-CM | POA: Insufficient documentation

## 2018-08-01 DIAGNOSIS — F17210 Nicotine dependence, cigarettes, uncomplicated: Secondary | ICD-10-CM | POA: Insufficient documentation

## 2018-08-01 DIAGNOSIS — Z1211 Encounter for screening for malignant neoplasm of colon: Secondary | ICD-10-CM | POA: Diagnosis not present

## 2018-08-01 DIAGNOSIS — R1319 Other dysphagia: Secondary | ICD-10-CM

## 2018-08-01 DIAGNOSIS — D125 Benign neoplasm of sigmoid colon: Secondary | ICD-10-CM

## 2018-08-01 DIAGNOSIS — R12 Heartburn: Secondary | ICD-10-CM | POA: Diagnosis not present

## 2018-08-01 HISTORY — PX: ESOPHAGOGASTRODUODENOSCOPY (EGD) WITH PROPOFOL: SHX5813

## 2018-08-01 HISTORY — PX: COLONOSCOPY WITH PROPOFOL: SHX5780

## 2018-08-01 LAB — GLUCOSE, CAPILLARY: Glucose-Capillary: 114 mg/dL — ABNORMAL HIGH (ref 70–99)

## 2018-08-01 SURGERY — COLONOSCOPY WITH PROPOFOL
Anesthesia: General

## 2018-08-01 MED ORDER — MIDAZOLAM HCL 2 MG/2ML IJ SOLN
INTRAMUSCULAR | Status: AC
  Filled 2018-08-01: qty 2

## 2018-08-01 MED ORDER — MIDAZOLAM HCL 2 MG/2ML IJ SOLN
INTRAMUSCULAR | Status: DC | PRN
  Administered 2018-08-01: 1 mg via INTRAVENOUS

## 2018-08-01 MED ORDER — PROPOFOL 500 MG/50ML IV EMUL
INTRAVENOUS | Status: AC
  Filled 2018-08-01: qty 50

## 2018-08-01 MED ORDER — PROPOFOL 500 MG/50ML IV EMUL
INTRAVENOUS | Status: DC | PRN
  Administered 2018-08-01: 180 ug/kg/min via INTRAVENOUS

## 2018-08-01 MED ORDER — FENTANYL CITRATE (PF) 100 MCG/2ML IJ SOLN
INTRAMUSCULAR | Status: AC
  Filled 2018-08-01: qty 2

## 2018-08-01 MED ORDER — FENTANYL CITRATE (PF) 100 MCG/2ML IJ SOLN
INTRAMUSCULAR | Status: DC | PRN
  Administered 2018-08-01: 25 ug via INTRAVENOUS
  Administered 2018-08-01: 50 ug via INTRAVENOUS

## 2018-08-01 MED ORDER — SODIUM CHLORIDE 0.9 % IV SOLN
INTRAVENOUS | Status: DC
  Administered 2018-08-01 (×2): via INTRAVENOUS

## 2018-08-01 MED ORDER — PROPOFOL 10 MG/ML IV BOLUS
INTRAVENOUS | Status: DC | PRN
  Administered 2018-08-01: 20 mg via INTRAVENOUS
  Administered 2018-08-01: 40 mg via INTRAVENOUS
  Administered 2018-08-01: 20 mg via INTRAVENOUS
  Administered 2018-08-01: 10 mg via INTRAVENOUS

## 2018-08-01 MED ORDER — OMEPRAZOLE 20 MG PO CPDR: 20.0000 mg | DELAYED_RELEASE_CAPSULE | Freq: Two times a day (BID) | ORAL | 1 refills | Status: DC

## 2018-08-01 MED ORDER — EPHEDRINE SULFATE 50 MG/ML IJ SOLN
INTRAMUSCULAR | Status: AC
  Filled 2018-08-01: qty 1

## 2018-08-01 MED ORDER — EPHEDRINE SULFATE 50 MG/ML IJ SOLN
INTRAMUSCULAR | Status: DC | PRN
  Administered 2018-08-01 (×2): 5 mg via INTRAVENOUS

## 2018-08-01 NOTE — Transfer of Care (Addendum)
Immediate Anesthesia Transfer of Care Note  Patient: [facility].  Procedure(s) Performed: COLONOSCOPY WITH PROPOFOL (N/A ) ESOPHAGOGASTRODUODENOSCOPY (EGD) WITH PROPOFOL (N/A )  Patient Location: PACU  Anesthesia Type:General  Level of Consciousness: awake and oriented  Airway & Oxygen Therapy: Patient Spontanous Breathing and Patient connected to nasal cannula oxygen  Post-op Assessment: Report given to RN and Post -op Vital signs reviewed and stable  Post vital signs: Reviewed and stable  Last Vitals: temp 97.5 Vitals Value Taken Time  BP    Temp    Pulse    Resp    SpO2      Last Pain:  Vitals:   08/01/18 0719  TempSrc: Tympanic  PainSc: 0-No pain         Complications: No apparent anesthesia complications

## 2018-08-01 NOTE — Op Note (Signed)
[facility] Patient Name: Ethan Fox Procedure Date: 08/01/2018 7:55 AM MRN: [id number] Account #: [id number] Date of Birth: 24-Jan-1971 Admit Type: Outpatient Age: 47 Room: [facility] ENDO ROOM 4 Gender: Male Note Status: Finalized Procedure:            Upper GI endoscopy Indications:          Dysphagia, Heartburn Providers:            Masiel Gentzler B. Bonna Gains MD, MD Referring MD:         Dionne Bucy. Bacigalupo (Referring MD) Medicines:            Monitored Anesthesia Care Complications:        No immediate complications. Procedure:            Pre-Anesthesia Assessment:                       - Prior to the procedure, a History and Physical was                        performed, and patient medications, allergies and                        sensitivities were reviewed. The patient's tolerance of                        previous anesthesia was reviewed.                       - The risks and benefits of the procedure and the                        sedation options and risks were discussed with the                        patient. All questions were answered and informed                        consent was obtained.                       - Patient identification and proposed procedure were                        verified prior to the procedure by the physician, the                        nurse, the anesthesiologist, the anesthetist and the                        technician. The procedure was verified in the procedure                        room.                       - ASA Grade Assessment: II - A patient with mild                        systemic disease.                       After  obtaining informed consent, the endoscope was                        passed under direct vision. Throughout the procedure,                        the patient's blood pressure, pulse, and oxygen                        saturations were monitored continuously. The Endoscope            was introduced through the mouth, and advanced to the                        second part of duodenum. The upper GI endoscopy was                        accomplished with ease. The patient tolerated the                        procedure well. Findings:      LA Grade B (one or more mucosal breaks greater than 5 mm, not extending       between the tops of two mucosal folds) esophagitis with no bleeding was       found in the distal esophagus. Biopsies were obtained from the proximal       and distal esophagus with cold forceps for histology of suspected       eosinophilic esophagitis.      Patchy mildly erythematous mucosa without bleeding was found in the       gastric antrum. Biopsies were taken with a cold forceps for histology.       Biopsies were obtained in the gastric body, at the incisura and in the       gastric antrum with cold forceps for histology.      The duodenal bulb, second portion of the duodenum and examined duodenum       were normal. Impression:           - LA Grade B reflux esophagitis. Biopsied.                       - Erythematous mucosa in the antrum. Biopsied.                       - Normal duodenal bulb, second portion of the duodenum                        and examined duodenum.                       - Biopsies were obtained in the gastric body, at the                        incisura and in the gastric antrum. Recommendation:       - Await pathology results.                       - Discharge patient to home (with escort).                       - Advance diet as  tolerated.                       - Continue present medications.                       - Patient has a contact number available for                        emergencies. The signs and symptoms of potential                        delayed complications were discussed with the patient.                        Return to normal activities tomorrow. Written discharge                        instructions  were provided to the patient.                       - Discharge patient to home (with escort).                       - The findings and recommendations were discussed with                        the patient.                       - The findings and recommendations were discussed with                        the patient's family.                       - Follow an antireflux regimen.                       - Use Prilosec (omeprazole) 20 mg PO BID for 1 month. Procedure Code(s):    --- Professional ---                       [phone], Esophagogastroduodenoscopy, flexible, transoral;                        with biopsy, single or multiple Diagnosis Code(s):    --- Professional ---                       K21.0, Gastro-esophageal reflux disease with esophagitis                       K31.89, Other diseases of stomach and duodenum                       R13.10, Dysphagia, unspecified                       R12, Heartburn CPT copyright 2017 American Medical Association. All rights reserved. The codes documented in this report are preliminary and upon coder review may  be revised to meet current compliance requirements.  Vonda Antigua, MD Margretta Sidle B. Berk Pilot MD, MD 08/01/2018 8:32:47 AM This report has been signed  electronically. Number of Addenda: 0 Note Initiated On: 08/01/2018 7:55 AM Estimated Blood Loss: Estimated blood loss: none.      [facility]

## 2018-08-01 NOTE — Anesthesia Procedure Notes (Signed)
Date/Time: 08/01/2018 8:20 AM Performed by: Allean Found, CRNA Pre-anesthesia Checklist: Patient identified, Emergency Drugs available, Suction available, Patient being monitored and Timeout performed Patient Re-evaluated:Patient Re-evaluated prior to induction Oxygen Delivery Method: Nasal cannula Placement Confirmation: positive ETCO2

## 2018-08-01 NOTE — H&P (Signed)
Ethan Antigua, MD [address] [place], [place], [place], [address] [address], [place], [place], [place], [address] Phone: [phone]  Fax: [phone]  Primary Care Physician:  Ethan Crews, MD   Pre-Procedure History & Physical: HPI:  [facility]. is a 47 y.o. male is here for a colonoscopy and EGD.   Past Medical History:  Diagnosis Date  . Constipation   . Diabetes mellitus without complication (Icard)   . GERD (gastroesophageal reflux disease)   . Hiatal hernia     Past Surgical History:  Procedure Laterality Date  . NO PAST SURGERIES      Prior to Admission medications   Medication Sig Start Date End Date Taking? Authorizing Provider  aspirin 81 MG chewable tablet Chew 81 mg by mouth daily.   Yes [provider]  buPROPion (WELLBUTRIN XL) 300 MG 24 hr tablet Take 1 tablet (300 mg total) by mouth daily. 06/27/18  Yes Fox, Ethan Bucy, MD  empagliflozin (JARDIANCE) 25 MG TABS tablet Take 25 mg by mouth daily. 06/27/18  Yes Fox, Ethan Bucy, MD  metFORMIN (GLUCOPHAGE-XR) 500 MG 24 hr tablet TAKE 2 TABLETS BY MOUTH DAILY WITH DINNER 07/30/18  Yes Fox, Ethan Bucy, MD  omeprazole (PRILOSEC) 20 MG capsule Take 20 mg by mouth daily.   Yes [provider]  rosuvastatin (CRESTOR) 40 MG tablet Take 1 tablet (40 mg total) by mouth daily. 02/02/18 02/02/19 Yes Fox, Ethan Bucy, MD  CINNAMON PO Take by mouth.    [provider]  polyethylene glycol powder (GLYCOLAX/MIRALAX) powder Take 17 g by mouth 2 (two) times daily. 08/30/17   Ethan Crews, MD    Allergies as of 06/29/2018  . (No Known Allergies)    Family History  Problem Relation Age of Onset  . COPD Mother 21       smoker  . Kidney disease Mother   . Diabetes Mother   . Pancreatic cancer Mother   . Diabetes Father   . Hypothyroidism Sister   . Heart disease Maternal Grandmother   . Heart disease Maternal Grandfather   . Heart disease Paternal  Grandmother 42       died of MI  . Colon cancer Neg Hx   . Breast cancer Neg Hx   . Prostate cancer Neg Hx   . Kidney cancer Neg Hx   . Bladder Cancer Neg Hx     Social History   Socioeconomic History  . Marital status: Single    Spouse name: Not on file  . Number of children: 3  . Years of education: Not on file  . Highest education level: Not on file  Occupational History  . Occupation: truck Geophysicist/field seismologist    Comment: works for [employer]  . Financial resource strain: Not on file  . Food insecurity:    Worry: Not on file    Inability: Not on file  . Transportation needs:    Medical: Not on file    Non-medical: Not on file  Tobacco Use  . Smoking status: Current Every Day Smoker    Packs/day: 1.00    Years: 15.00    Pack years: 15.00    Types: Cigarettes  . Smokeless tobacco: Never Used  . Tobacco comment: started smoking at age 82; has quit for 15 years but is currently smoking. Has decreased cigarette use from 1 PPD to 0.5 PPD since starting Wellbutrin (06/27/2018)  Substance and Sexual Activity  . Alcohol use: Yes  Comment: Rarely  . Drug use: No  . Sexual activity: Yes    Partners: Female  Lifestyle  . Physical activity:    Days per week: Not on file    Minutes per session: Not on file  . Stress: Not on file  Relationships  . Social connections:    Talks on phone: Not on file    Gets together: Not on file    Attends religious service: Not on file    Active member of club or organization: Not on file    Attends meetings of clubs or organizations: Not on file    Relationship status: Not on file  . Intimate partner violence:    Fear of current or ex partner: Not on file    Emotionally abused: Not on file    Physically abused: Not on file    Forced sexual activity: Not on file  Other Topics Concern  . Not on file  Social History Narrative  . Not on file    Review of Systems: See HPI, otherwise negative ROS  Physical Exam: BP (!) 125/95    Pulse 92   Temp (!) 97.3 F (36.3 C) (Tympanic)   Resp 16   Ht 5\' 10"  (1.778 m)   Wt 99.8 kg   SpO2 100%   BMI 31.57 kg/m  General:   Alert,  pleasant and cooperative in NAD Head:  Normocephalic and atraumatic. Neck:  Supple; no masses or thyromegaly. Lungs:  Clear throughout to auscultation, normal respiratory effort.    Heart:  +S1, +S2, Regular rate and rhythm, No edema. Abdomen:  Soft, nontender and nondistended. Normal bowel sounds, without guarding, and without rebound.   Neurologic:  Alert and  oriented x4;  grossly normal neurologically.  Impression/Plan: [name] is here for a colonoscopy to be performed for average risk screening and EGD for Acid Reflux.  Risks, benefits, limitations, and alternatives regarding the procedures have been reviewed with the patient.  Questions have been answered.  All parties agreeable.   Virgel Manifold, MD  08/01/2018, 8:08 AM

## 2018-08-01 NOTE — Anesthesia Post-op Follow-up Note (Signed)
Anesthesia QCDR form completed.        

## 2018-08-01 NOTE — Anesthesia Preprocedure Evaluation (Signed)
Anesthesia Evaluation  Patient identified by MRN, date of birth, ID band Patient awake    Reviewed: Allergy & Precautions, H&P , NPO status , Patient's Chart, lab work & pertinent test results, reviewed documented beta blocker date and time   Airway Mallampati: II   Neck ROM: full    Dental  (+) Poor Dentition, Teeth Intact   Pulmonary neg pulmonary ROS, Current Smoker,    Pulmonary exam normal        Cardiovascular Exercise Tolerance: Good negative cardio ROS Normal cardiovascular exam Rhythm:regular Rate:Normal     Neuro/Psych  Neuromuscular disease negative neurological ROS  negative psych ROS   GI/Hepatic negative GI ROS, Neg liver ROS, hiatal hernia, GERD  Medicated,  Endo/Other  negative endocrine ROSdiabetes, Well Controlled  Renal/GU negative Renal ROS  negative genitourinary   Musculoskeletal   Abdominal   Peds  Hematology negative hematology ROS (+)   Anesthesia Other Findings Past Medical History: No date: Constipation No date: Diabetes mellitus without complication (HCC) No date: GERD (gastroesophageal reflux disease) No date: Hiatal hernia Past Surgical History: No date: NO PAST SURGERIES BMI    Body Mass Index:  31.57 kg/m     Reproductive/Obstetrics negative OB ROS                             Anesthesia Physical Anesthesia Plan  ASA: II  Anesthesia Plan: General   Post-op Pain Management:    Induction:   PONV Risk Score and Plan:   Airway Management Planned:   Additional Equipment:   Intra-op Plan:   Post-operative Plan:   Informed Consent: I have reviewed the patients History and Physical, chart, labs and discussed the procedure including the risks, benefits and alternatives for the proposed anesthesia with the patient or authorized representative who has indicated his/her understanding and acceptance.   Dental Advisory Given  Plan Discussed with:  CRNA  Anesthesia Plan Comments:         Anesthesia Quick Evaluation

## 2018-08-01 NOTE — Op Note (Signed)
[facility]astro[facility]ology Patient Name: Ethan Fox Procedure Date: 08/01/2018 7:54 AM MRN: [id number] Account #: [id number] Date of Birth: [birth year] Admit Type: Outpatient Age: 47 Room: [facility] ENDO ROOM 4 Gender: Male Note Status: Finalized Procedure:            Colonoscopy Indications:          Screening for colorectal malignant neoplasm Providers:            Varnita B. Bonna Gains MD, MD Referring MD:         Dionne Bucy. Bacigalupo (Referring MD) Medicines:            Monitored Anesthesia Care Complications:        No immediate complications. Procedure:            Pre-Anesthesia Assessment:                       - ASA Grade Assessment: II - A patient with mild                        systemic disease.                       - Prior to the procedure, a History and Physical was                        performed, and patient medications, allergies and                        sensitivities were reviewed. The patient's tolerance of                        previous anesthesia was reviewed.                       - The risks and benefits of the procedure and the                        sedation options and risks were discussed with the                        patient. All questions were answered and informed                        consent was obtained.                       - Patient identification and proposed procedure were                        verified prior to the procedure by the physician, the                        nurse, the anesthesiologist, the anesthetist and the                        technician. The procedure was verified in the procedure                        room.  After obtaining informed consent, the colonoscope was                        passed under direct vision. Throughout the procedure,                        the patient's blood pressure, pulse, and oxygen                        saturations were monitored continuously. The                    Colonoscope was introduced through the anus and                        advanced to the the cecum, identified by appendiceal                        orifice and ileocecal valve. The colonoscopy was                        performed with ease. The patient tolerated the                        procedure well. The quality of the bowel preparation                        was good. Findings:      The perianal and digital rectal examinations were normal.      A 5 mm polyp was found in the transverse colon. The polyp was sessile.       The polyp was removed with a cold snare. Resection and retrieval were       complete.      Three sessile polyps were found in the transverse colon. The polyps were       2 to 4 mm in size. These polyps were removed with a cold biopsy forceps.       Resection and retrieval were complete.      A 4 mm polyp was found in the sigmoid colon. The polyp was sessile. The       polyp was removed with a cold biopsy forceps. Resection and retrieval       were complete.      The exam was otherwise without abnormality.      The rectum, sigmoid colon, descending colon, transverse colon, ascending       colon and cecum appeared normal.      The retroflexed view of the distal rectum and anal verge was normal and       showed no anal or rectal abnormalities. Impression:           - One 5 mm polyp in the transverse colon, removed with                        a cold snare. Resected and retrieved.                       - Three 2 to 4 mm polyps in the transverse colon,                        removed with a cold biopsy forceps. Resected  and                        retrieved.                       - One 4 mm polyp in the sigmoid colon, removed with a                        cold biopsy forceps. Resected and retrieved.                       - The examination was otherwise normal.                       - The rectum, sigmoid colon, descending colon,                         transverse colon, ascending colon and cecum are normal.                       - The distal rectum and anal verge are normal on                        retroflexion view. Recommendation:       - Discharge patient to home (with escort).                       - Advance diet as tolerated.                       - Continue present medications.                       - Await pathology results.                       - Repeat colonoscopy in 3 - 5 years for surveillance.                       - The findings and recommendations were discussed with                        the patient.                       - The findings and recommendations were discussed with                        the patient's family.                       - Return to primary care physician as previously                        scheduled. Procedure Code(s):    --- Professional ---                       [phone], Colonoscopy, flexible; with removal of tumor(s),                        polyp(s), or other lesion(s) by snare technique  99371, 55, Colonoscopy, flexible; with biopsy, single                        or multiple Diagnosis Code(s):    --- Professional ---                       Z12.11, Encounter for screening for malignant neoplasm                        of colon                       D12.3, Benign neoplasm of transverse colon (hepatic                        flexure or splenic flexure)                       D12.5, Benign neoplasm of sigmoid colon CPT copyright 2017 American Medical Association. All rights reserved. The codes documented in this report are preliminary and upon coder review may  be revised to meet current compliance requirements.  Vonda Antigua, MD Margretta Sidle B. Bonna Gains MD, MD 08/01/2018 9:15:08 AM This report has been signed electronically. Number of Addenda: 0 Note Initiated On: 08/01/2018 7:54 AM Scope Withdrawal Time: 0 hours 24 minutes 1 second  Total Procedure Duration: 0 hours 32  minutes 19 seconds  Estimated Blood Loss: Estimated blood loss: none.      [facility]

## 2018-08-02 ENCOUNTER — Encounter: Payer: Self-pay | Admitting: Gastroenterology

## 2018-08-02 ENCOUNTER — Ambulatory Visit: Payer: BLUE CROSS/BLUE SHIELD | Admitting: Gastroenterology

## 2018-08-02 VITALS — BP 125/84 | HR 92 | Ht 70.0 in | Wt 216.2 lb

## 2018-08-02 DIAGNOSIS — R1012 Left upper quadrant pain: Secondary | ICD-10-CM | POA: Diagnosis not present

## 2018-08-02 NOTE — OR Nursing (Signed)
PT c/o abdominal pain. COMES AND GOES . HAS BEEN HURTING 75% OF THE NIGHT. DR Holley Raring notified,

## 2018-08-02 NOTE — Anesthesia Postprocedure Evaluation (Signed)
Anesthesia Post Note  Patient: [facility].  Procedure(s) Performed: COLONOSCOPY WITH PROPOFOL (N/A ) ESOPHAGOGASTRODUODENOSCOPY (EGD) WITH PROPOFOL (N/A )  Patient location during evaluation: PACU Anesthesia Type: General Level of consciousness: awake and alert Pain management: pain level controlled Vital Signs Assessment: post-procedure vital signs reviewed and stable Respiratory status: spontaneous breathing, nonlabored ventilation, respiratory function stable and patient connected to nasal cannula oxygen Cardiovascular status: blood pressure returned to baseline and stable Postop Assessment: no apparent nausea or vomiting Anesthetic complications: no     Last Vitals:  Vitals:   08/01/18 0719 08/01/18 0918  BP: (!) 125/95 102/69  Pulse: 92 81  Resp: 16 18  Temp: (!) 36.3 C (!) 36.1 C  SpO2: 100% 95%    Last Pain:  Vitals:   08/02/18 0741  TempSrc:   PainSc: [place]

## 2018-08-02 NOTE — Progress Notes (Signed)
Ethan Antigua, MD [address]  [place]  [place], [place] [address]  Main: [phone]  Fax: [phone]   Primary Care Physician: Virginia Crews, MD  Primary Gastroenterologist:  Dr. Vonda Fox  Chief complaint: Abdominal pain  HPI: [facility]. is a 47 y.o. male initially seen due to heartburn and chest pain occurring despite PPI use daily.  Underwent EGD and colonoscopy yesterday, August 01, 2018.  EGD showed grade B esophagitis in the distal esophagus, gastric erythema, normal duodenum.  Biopsies pending.  Screening colonoscopy with 5 total polyps removed from the transverse and sigmoid colon, varying from 2 to 5 mm in size.  Exam otherwise normal.  Pathology report pending.  Patient presents today due to abdominal pain postprocedure.  Patient reports left upper quadrant and midepigastric pain that started yesterday after procedure.  Reports normal flatulence, and a normal bowel movement that was soft this morning.  No blood in bowel movements.  This morning he reported the abdominal pain was better than what it was last night, and states that this time abdominal pain is half of what it was this morning.  He is feeling much better.  Ate today without difficulty.  Describes the pain as cramping, 2/10, nonradiating.  Current Outpatient Medications  Medication Sig Dispense Refill  . aspirin 81 MG chewable tablet Chew 81 mg by mouth daily.    Marland Kitchen buPROPion (WELLBUTRIN XL) 300 MG 24 hr tablet Take 1 tablet (300 mg total) by mouth daily. 30 tablet 5  . CINNAMON PO Take by mouth.    . empagliflozin (JARDIANCE) 25 MG TABS tablet Take 25 mg by mouth daily. 30 tablet 5  . metFORMIN (GLUCOPHAGE-XR) 500 MG 24 hr tablet TAKE 2 TABLETS BY MOUTH DAILY WITH DINNER 180 tablet 11  . omeprazole (PRILOSEC) 20 MG capsule Take 1 capsule (20 mg total) by mouth 2 (two) times daily before a meal. 60 capsule 1  . polyethylene glycol powder (GLYCOLAX/MIRALAX) powder Take 17 g by  mouth 2 (two) times daily. 3350 g 3  . rosuvastatin (CRESTOR) 40 MG tablet Take 1 tablet (40 mg total) by mouth daily. 30 tablet 11   No current facility-administered medications for this visit.     Allergies as of 08/02/2018  . (No Known Allergies)    ROS:  General: Negative for anorexia, weight loss, fever, chills, fatigue, weakness. ENT: Negative for hoarseness, difficulty swallowing , nasal congestion. CV: Negative for chest pain, angina, palpitations, dyspnea on exertion, peripheral edema.  Respiratory: Negative for dyspnea at rest, dyspnea on exertion, cough, sputum, wheezing.  GI: See history of present illness. GU:  Negative for dysuria, hematuria, urinary incontinence, urinary frequency, nocturnal urination.  Endo: Negative for unusual weight change.    Physical Examination: Vitals:   08/02/18 1436  BP: 125/84  Pulse: 92  Weight: 216 lb 3.2 oz (98.1 kg)  Height: 5\' 10"  (1.778 m)    General: Well-nourished, well-developed in no acute distress.  Eyes: No icterus. Conjunctivae pink. Mouth: Oropharyngeal mucosa moist and pink , no lesions erythema or exudate. Neck: Supple, Trachea midline Abdomen: Bowel sounds are normal, nontender, nondistended, no hepatosplenomegaly or masses, no abdominal bruits or hernia , no rebound or guarding.   Extremities: No lower extremity edema. No clubbing or deformities. Neuro: Alert and oriented x 3.  Grossly intact. Skin: Warm and dry, no jaundice.   Psych: Alert and cooperative, normal mood and affect.   Labs: CMP     Component Value Date/Time   NA  144 05/14/2018 0855   K 4.5 05/14/2018 0855   CL 102 05/14/2018 0855   CO2 20 05/14/2018 0855   GLUCOSE 157 (H) 05/14/2018 0855   GLUCOSE 110 (H) 09/28/2017 1154   BUN 16 05/14/2018 0855   CREATININE 1.19 05/14/2018 0855   CREATININE 0.84 09/28/2017 1154   CALCIUM 9.5 05/14/2018 0855   PROT 7.0 07/23/2018 0826   ALBUMIN 4.3 07/23/2018 0826   AST 19 07/23/2018 0826   ALT 27  07/23/2018 0826   ALKPHOS 64 07/23/2018 0826   BILITOT 0.2 07/23/2018 0826   GFRNONAA 74 05/14/2018 0855   GFRNONAA 106 09/28/2017 1154   GFRAA 85 05/14/2018 0855   GFRAA 123 09/28/2017 1154   Lab Results  Component Value Date   WBC 7.5 09/28/2017   HGB 16.7 07/23/2018   HCT 49.3 07/23/2018   MCV 85.6 09/28/2017   PLT 168 09/28/2017    Imaging Studies: No results found.  Assessment and Plan:   [facility]. is a 47 y.o. y/o male presents for abdominal pain post EGD and colonoscopy yesterday  Abdominal exam is completely benign Possibly musculoskeletal pain versus pain from underlying GERD given its location Postprocedural pain from insufflation of the colon with CO2 is also possible Pain is much improved at this time No indication for imaging given patient reports passing gas, normal bowel movement, normal appetite and no alarm symptoms  If pain worsens or does not improve, patient was asked to contact us immediately and he verbalized understanding He has not picked up his omeprazole yet I have asked him to start taking it as it may help with his pain as well He verbalized understanding  (Risks of PPI use were discussed with patient including bone loss, C. Diff diarrhea, pneumonia, infections, CKD, electrolyte abnormalities.  If clinically possible based on symptoms, goal would be to maintain patient on the lowest dose possible, or discontinue the medication with institution of acid reflux lifestyle modifications over time. Pt. Verbalizes understanding and chooses to continue the medication.)  Biopsy results from EGD and colonoscopy pending at this time  Dr Ethan Fox

## 2018-08-03 LAB — SURGICAL PATHOLOGY

## 2018-08-13 ENCOUNTER — Encounter: Payer: Self-pay | Admitting: Urology

## 2018-08-13 ENCOUNTER — Encounter: Payer: Self-pay | Admitting: Gastroenterology

## 2018-08-13 ENCOUNTER — Ambulatory Visit: Payer: BLUE CROSS/BLUE SHIELD | Admitting: Urology

## 2018-08-13 ENCOUNTER — Other Ambulatory Visit: Payer: Self-pay

## 2018-08-13 ENCOUNTER — Telehealth: Payer: Self-pay | Admitting: Family Medicine

## 2018-08-13 VITALS — BP 132/88 | HR 98 | Ht 69.0 in | Wt 220.1 lb

## 2018-08-13 DIAGNOSIS — N401 Enlarged prostate with lower urinary tract symptoms: Secondary | ICD-10-CM | POA: Diagnosis not present

## 2018-08-13 DIAGNOSIS — E349 Endocrine disorder, unspecified: Secondary | ICD-10-CM

## 2018-08-13 DIAGNOSIS — R972 Elevated prostate specific antigen [PSA]: Secondary | ICD-10-CM | POA: Diagnosis not present

## 2018-08-13 DIAGNOSIS — N138 Other obstructive and reflux uropathy: Secondary | ICD-10-CM | POA: Diagnosis not present

## 2018-08-13 DIAGNOSIS — N529 Male erectile dysfunction, unspecified: Secondary | ICD-10-CM

## 2018-08-13 DIAGNOSIS — R3989 Other symptoms and signs involving the genitourinary system: Secondary | ICD-10-CM

## 2018-08-13 LAB — BLADDER SCAN AMB NON-IMAGING: Scan Result: 0

## 2018-08-13 MED ORDER — NYSTATIN 100000 UNIT/GM EX CREA: 1.0000 "application " | TOPICAL_CREAM | Freq: Two times a day (BID) | CUTANEOUS | 0 refills | Status: AC

## 2018-08-13 MED ORDER — SILDENAFIL CITRATE 20 MG PO TABS: ORAL_TABLET | ORAL | 3 refills | Status: AC

## 2018-08-13 MED ORDER — CLOMIPHENE CITRATE 50 MG PO TABS: ORAL_TABLET | ORAL | 3 refills | Status: AC

## 2018-08-13 NOTE — Telephone Encounter (Signed)
Pt stated that Dr. B advised pt when she put him on empagliflozin (JARDIANCE) 25 MG TABS tablet to let her know if he had any infection while taking the medication. Pt stated that he went to his urologist this morning and she thinks he has a yeast infection and gave him nystatin cream (MYCOSTATIN) for treatment. Pt stated he just wanted to let Dr. B know. Please advise. Thanks TNP

## 2018-08-13 NOTE — Progress Notes (Signed)
08/13/2018 10:48 AM   Ethan Derrick Jr. [birth year] [id number]  Referring provider: Virginia Crews, MD [address] [place], [place] [address]  Chief Complaint  Patient presents with  . Follow-up    HPI: Patient is a 47 year old Caucasian male with DM, BPH with LU TS, testosterone deficiency and history of a rising PSA who presents today for a follow up.    History of rising PSA PSA Trend  2.6 on 10/12/2017  3.2 on 02/12/2018  4K score 8% on 03/28/2018  2.4 on 03/28/2018  2.1 on 06/28/2018  BPH WITH LUTS  (prostate and/or bladder) IPSS score: 20/3  PVR: 0 mL   Previous score: 10/2  Previous PVR:167 mL  Major complaint(s):  Weak stream and frequency x three to four months. Denies any dysuria, hematuria or suprapubic pain.   Denies any recent fevers, chills, nausea or vomiting.  He does not have a family history of PCa.  IPSS    Row Name 08/13/18 0800         International Prostate Symptom Score   How often have you had the sensation of not emptying your bladder?  Less than 1 in 5     How often have you had to urinate less than every two hours?  About half the time     How often have you found you stopped and started again several times when you urinated?  About half the time     How often have you found it difficult to postpone urination?  More than half the time     How often have you had a weak urinary stream?  More than half the time     How often have you had to strain to start urination?  Less than 1 in 5 times     How many times did you typically get up at night to urinate?  4 Times     Total IPSS Score  20       Quality of Life due to urinary symptoms   If you were to spend the rest of your life with your urinary condition just the way it is now how would you feel about that?  Mixed        Score:  1-7 Mild 8-19 Moderate 20-35 Severe   Testosterone deficiency He is still having spontaneous erections at night.  Girlfriend states  that she feels she has witnessed apneic episodes while he was sleeping.  He has not had a formal sleep study.   He is reporting a loss of body hair, reduced beard growth, obesity, poor memory, type 2 diabetes mellitus.  Recent testosterone level was 333 in 07/2018.  He is taking 1/2 tablet Clomid daily.    Erectile dysfunction His SHIM score is 14, which is mild to moderate ED.  His previous SHIM score was 19.  His major complaint is not having erections when he is wanting to have sex.  His libido is diminished.   His risk factors for ED are age, BPH, DM, HLD, sleep apnea, night shift work and smoking.   He denies any painful erections or curvatures with his erections.   He is no longer still having spontaneous erections.    SHIM    Row Name 08/13/18 0859         SHIM: Over the last 6 months:   How do you rate your confidence that you could get and keep an erection?  Low  When you had erections with sexual stimulation, how often were your erections hard enough for penetration (entering your partner)?  Sometimes (about half the time)     During sexual intercourse, how often were you able to maintain your erection after you had penetrated (entered) your partner?  Sometimes (about half the time)     During sexual intercourse, how difficult was it to maintain your erection to completion of intercourse?  Difficult     When you attempted sexual intercourse, how often was it satisfactory for you?  Sometimes (about half the time)       SHIM Total Score   SHIM  14        Score: 1-7 Severe ED 8-11 Moderate ED 12-16 Mild-Moderate ED 17-21 Mild ED 22-25 No ED   PMH: Past Medical History:  Diagnosis Date  . Constipation   . Diabetes mellitus without complication ([place])   . GERD (gastroesophageal reflux disease)   . Hiatal hernia     Surgical History: Past Surgical History:  Procedure Laterality Date  . COLONOSCOPY WITH PROPOFOL N/A 08/01/2018   Procedure: COLONOSCOPY WITH PROPOFOL;   Surgeon: Virgel Manifold, MD;  Location: [facility] ENDOSCOPY;  Service: Endoscopy;  Laterality: N/A;  . ESOPHAGOGASTRODUODENOSCOPY (EGD) WITH PROPOFOL N/A 08/01/2018   Procedure: ESOPHAGOGASTRODUODENOSCOPY (EGD) WITH PROPOFOL;  Surgeon: Virgel Manifold, MD;  Location: [facility] ENDOSCOPY;  Service: Endoscopy;  Laterality: N/A;  . NO PAST SURGERIES      Home Medications:  Allergies as of 08/13/2018   No Known Allergies     Medication List        Accurate as of 08/13/18 10:48 AM. Always use your most recent med list.          aspirin 81 MG chewable tablet Chew 81 mg by mouth daily.   buPROPion 300 MG 24 hr tablet Commonly known as:  WELLBUTRIN XL Take 1 tablet (300 mg total) by mouth daily.   CINNAMON PO Take by mouth.   clomiPHENE 50 MG tablet Commonly known as:  CLOMID Take 1/2 tablet daily   empagliflozin 25 MG Tabs tablet Commonly known as:  JARDIANCE Take 25 mg by mouth daily.   metFORMIN 500 MG 24 hr tablet Commonly known as:  GLUCOPHAGE-XR TAKE 2 TABLETS BY MOUTH DAILY WITH DINNER   mupirocin ointment 2 % Commonly known as:  BACTROBAN APPLY TOPICALLY TWICE DAILY   nystatin cream Commonly known as:  MYCOSTATIN Apply 1 application topically 2 (two) times daily.   omeprazole 20 MG capsule Commonly known as:  PRILOSEC Take 1 capsule (20 mg total) by mouth 2 (two) times daily before a meal.   polyethylene glycol powder powder Commonly known as:  GLYCOLAX/MIRALAX Take 17 g by mouth 2 (two) times daily.   rosuvastatin 40 MG tablet Commonly known as:  CRESTOR Take 1 tablet (40 mg total) by mouth daily.   sildenafil 20 MG tablet Commonly known as:  REVATIO Take 3 to 5 tablets two hours before intercouse on an empty stomach.  Do not take with nitrates.       Allergies: No Known Allergies  Family History: Family History  Problem Relation Age of Onset  . COPD Mother 51       smoker  . Kidney disease Mother   . Diabetes Mother   . Pancreatic cancer  Mother   . Diabetes Father   . Hypothyroidism Sister   . Heart disease Maternal Grandmother   . Heart disease Maternal Grandfather   . Heart disease Paternal  Grandmother 46       died of MI  . Colon cancer Neg Hx   . Breast cancer Neg Hx   . Prostate cancer Neg Hx   . Kidney cancer Neg Hx   . Bladder Cancer Neg Hx     Social History:  reports that he has been smoking cigarettes. He has a 15.00 pack-year smoking history. He has never used smokeless tobacco. He reports that he drinks alcohol. He reports that he does not use drugs.  ROS: UROLOGY Frequent Urination?: No Hard to postpone urination?: No Burning/pain with urination?: No Get up at night to urinate?: No Leakage of urine?: No Urine stream starts and stops?: No Trouble starting stream?: No Do you have to strain to urinate?: No Blood in urine?: No Urinary tract infection?: No Sexually transmitted disease?: No Injury to kidneys or bladder?: No Painful intercourse?: No Weak stream?: Yes Erection problems?: No Penile pain?: No  Gastrointestinal Nausea?: No Vomiting?: No Indigestion/heartburn?: No Diarrhea?: No Constipation?: No  Constitutional Fever: No Night sweats?: No Weight loss?: No Fatigue?: No  Skin Skin rash/lesions?: No Itching?: No  Eyes Blurred vision?: No Double vision?: No  Ears/Nose/Throat Sore throat?: No Sinus problems?: No  Hematologic/Lymphatic Swollen glands?: No Easy bruising?: No  Cardiovascular Leg swelling?: No Chest pain?: No  Respiratory Cough?: No Shortness of breath?: No  Endocrine Excessive thirst?: No  Musculoskeletal Back pain?: No Joint pain?: No  Neurological Headaches?: No Dizziness?: No  Psychologic Depression?: No Anxiety?: No  Physical Exam: BP 132/88   Pulse 98   Ht 5\' 9"  (1.753 m)   Wt 220 lb 1.6 oz (99.8 kg)   BMI 32.50 kg/m   Constitutional: Well nourished. Alert and oriented, No acute distress. HEENT: [place] AT, moist mucus  membranes. Trachea midline, no masses. Cardiovascular: No clubbing, cyanosis, or edema. Respiratory: Normal respiratory effort, no increased work of breathing. GI: Abdomen is soft, non tender, non distended, no abdominal masses. Liver and spleen not palpable.  No hernias appreciated.  Stool sample for occult testing is not indicated.   GU: No CVA tenderness.  No bladder fullness or masses.  Patient with uncircumcised phallus. Foreskin easily retracted.  Balanitis is present.  Urethral meatus is patent.  No penile discharge. No penile lesions or rashes. Scrotum without lesions, cysts, rashes and/or edema.  Testicles are located scrotally bilaterally. No masses are appreciated in the testicles. Left and right epididymis are normal. Rectal: Patient with  normal sphincter tone. Anus and perineum without scarring or rashes. No rectal masses are appreciated. Prostate is approximately 45 grams, right lobe > left lobe, no nodules are appreciated. Seminal vesicles are normal. Skin: No rashes, bruises or suspicious lesions. Lymph: No cervical or inguinal adenopathy. Neurologic: Grossly intact, no focal deficits, moving all 4 extremities. Psychiatric: Normal mood and affect.   Laboratory Data: Lab Results  Component Value Date   WBC 7.5 09/28/2017   HGB 16.7 07/23/2018   HCT 49.3 07/23/2018   MCV 85.6 09/28/2017   PLT 168 09/28/2017    Lab Results  Component Value Date   CREATININE 1.19 05/14/2018    Lab Results  Component Value Date   AST 19 07/23/2018   Lab Results  Component Value Date   ALT 27 07/23/2018   Results for orders placed or performed in visit on 08/13/18  BLADDER SCAN AMB NON-IMAGING  Result Value Ref Range   Scan Result 0 mL    I have reviewed the labs.   Pertinent Imaging: See above  I have independently reviewed the films.    Assessment & Plan:    1. History of rising PSA 4K score 8% Recent PSA 2.1  2. Abnormal prostate exam Discussed obtaining an MRI  vs biopsy at this time After discussing how a biopsy is performed and the risks involved, patient would like to pursue MRI at this time If his insurance will not cover the MRI, he will undergo the biopsy The procedure is explained and the risks involved, such as blood in urine, blood in stool, blood in semen, infection, urinary retention, and on rare occasions sepsis and death.  Patient is on ASA.    3. Testosterone deficiency Asked patient to discontinue the Clomid until MRI or biopsy completed   4. Balanitis Seeing PCP today for further discussion concerning the Jardiance  Script give for Nystatin cream to apply bid  If continues to reoccur, may need to consider circumcision   5. BPH with LUTS IPSS score is 20/3, it is worse  Continue conservative management, avoiding bladder irritants and timed voiding's Most bothersome symptoms is/are weak stream and frequency  6. Erectile dysfunction SHIM score is 14, it is worsening Will have a trial of Sildenafil 20 mg, 3 to 5 tablets two hours prior to intercourse on an empty stomach, # 50; he is warned not to take medications that contain nitrates.  I also advised him of the side effects, such as: headache, flushing, dyspepsia, abnormal vision, nasal congestion, back pain, myalgia, nausea, dizziness, and rash.  Return for MRI results .  These notes generated with voice recognition software. I apologize for typographical errors.  Zara Council, PA-C  [facility] [address] [place], [place] [address] [phone]

## 2018-08-13 NOTE — Telephone Encounter (Signed)
Patient advised as below. Patient verbalizes understanding and is in agreement with treatment plan.  

## 2018-08-13 NOTE — Telephone Encounter (Signed)
He can hold the medication while treating this infection.  He can resume it after infection resolves.  At upcoming appt, may need to discuss switching to a different class of diabetes medication due to this side effect.  Virginia Crews, MD, MPH [facility] 08/13/2018 1:40 PM

## 2018-08-14 LAB — PROLACTIN: Prolactin: 2.7 ng/mL — ABNORMAL LOW (ref 4.0–15.2)

## 2018-08-21 ENCOUNTER — Encounter: Payer: Self-pay | Admitting: Urology

## 2018-08-21 ENCOUNTER — Telehealth: Payer: Self-pay | Admitting: Family Medicine

## 2018-08-21 DIAGNOSIS — B379 Candidiasis, unspecified: Secondary | ICD-10-CM

## 2018-08-21 MED ORDER — FLUCONAZOLE 150 MG PO TABS: 150.0000 mg | ORAL_TABLET | Freq: Once | ORAL | 0 refills | Status: AC

## 2018-08-21 NOTE — Telephone Encounter (Signed)
LMTCB

## 2018-08-21 NOTE — Telephone Encounter (Signed)
Yes he can but I will send in a Rx for diflucan as well as he will definitely get another yeast infection with his sugars being elevated. He will need to see Dr. B for adjustments in medication therapy if he continues to get yeast infections with Jardiance. These normally improve and stop once sugars are better managed.

## 2018-08-21 NOTE — Telephone Encounter (Signed)
Pt's girlfriend called saying pt was taking Jardiance until he got a yeast infection and was taken off of it then.  He has not went back on the [place] yet.   His sugar has gone up now and today around 400.  Please advise  CB#  [phone]  Thanks Con Memos

## 2018-08-21 NOTE — Telephone Encounter (Signed)
Wrong acct

## 2018-08-21 NOTE — Telephone Encounter (Signed)
Patient's girlfriend Sharyn Lull advised.

## 2018-08-21 NOTE — Telephone Encounter (Signed)
Pt's girlfriend call saying pt was taking Jardiance until he had a yeast infection and was taken off of it.  Since then his glucose has been rising. Today it around 400.  She wants to know if he can start taking the Jardiance again. He does have the medication at home.  Please advise  [phone] otr [phone]   Thanks Con Memos

## 2018-09-17 ENCOUNTER — Telehealth: Payer: Self-pay | Admitting: Urology

## 2018-09-17 NOTE — Telephone Encounter (Signed)
Please let Ethan Fox know that his insurance denied the MRI of his prostate.  We need to proceed with the prostate biopsy at this time.

## 2018-09-18 NOTE — Telephone Encounter (Signed)
Patient notified, appoint scheduled and cardiac clearance faxed.

## 2018-09-21 ENCOUNTER — Telehealth: Payer: Self-pay

## 2018-09-21 IMAGING — CT CT PELVIS W/ CM
2 of 3 series · 17 of 46 positions shown, 19 images · IV contrast (iopamidol)
Comparison: None.

CLINICAL DATA: 45-year-old diabetic male with abscess near rectum
for 3 days. Patient reports frequent abscesses. Denies fever.

EXAM:
CT PELVIS WITH CONTRAST
TECHNIQUE: Multidetector CT imaging of the pelvis was performed using the
standard protocol following the bolus administration of intravenous
contrast.
CONTRAST:  100mL S04V1T-8HH IOPAMIDOL (S04V1T-8HH) INJECTION 61%

[Series 2: routine abd/pel with · axial · 0.80mm/px · z∈[-443,-173]mm · 14 of 64 slices shown, 16 images]
[im 5/64  soft-tissue]
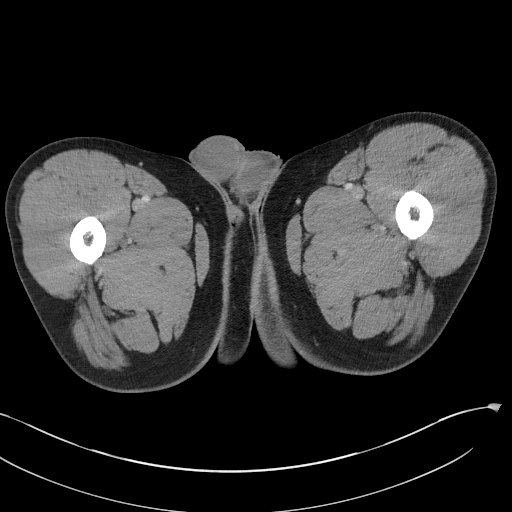
[im 5/64  bone]
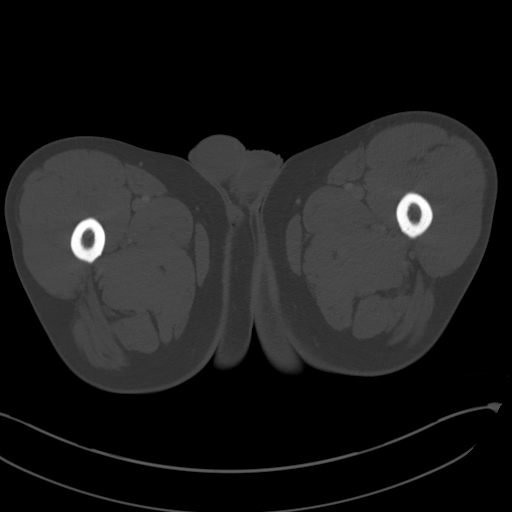
[im 9/64  soft-tissue]
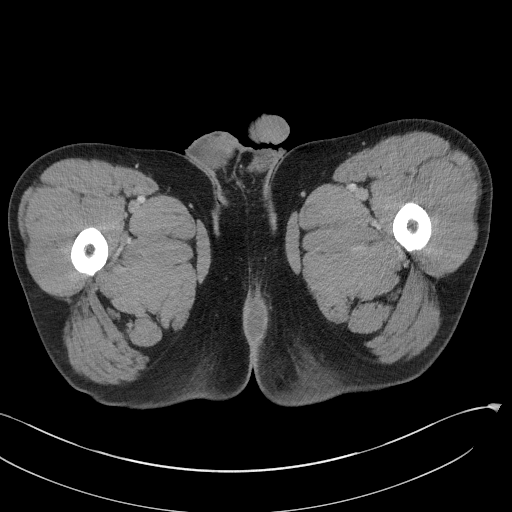
[im 13/64  soft-tissue]
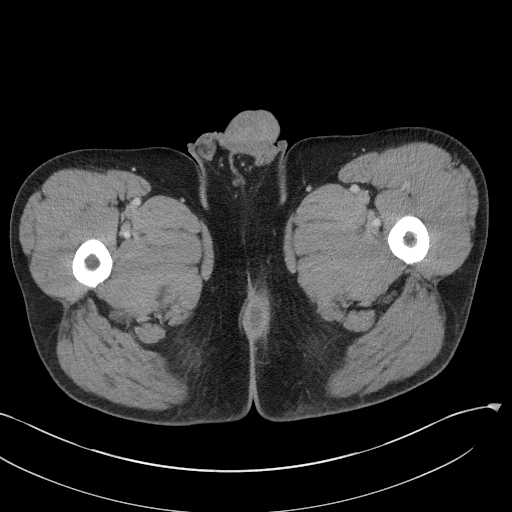
[im 17/64  soft-tissue]
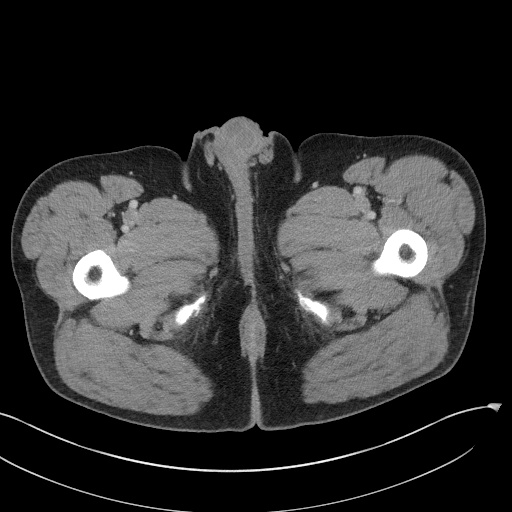
[im 21/64  soft-tissue]
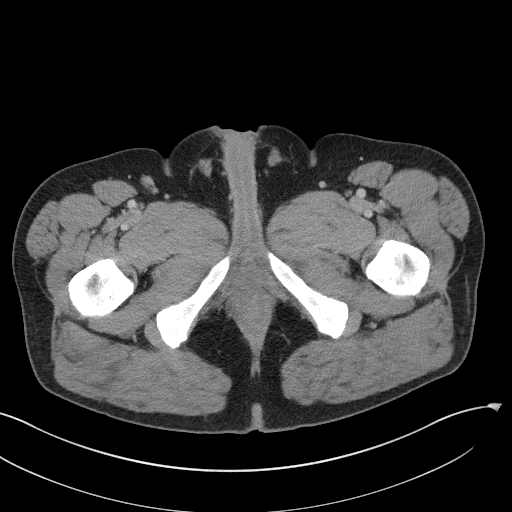
[im 25/64  soft-tissue]
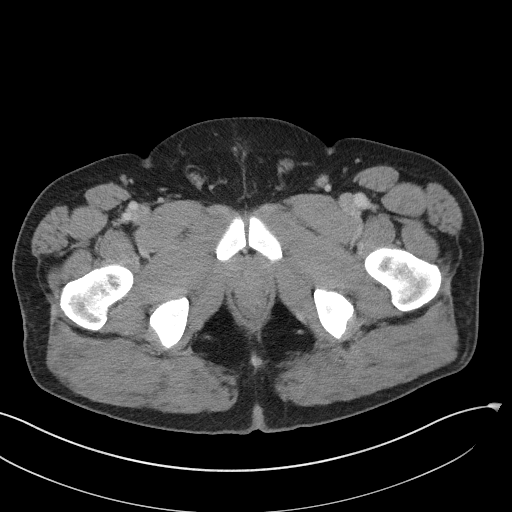
[im 29/64  soft-tissue]
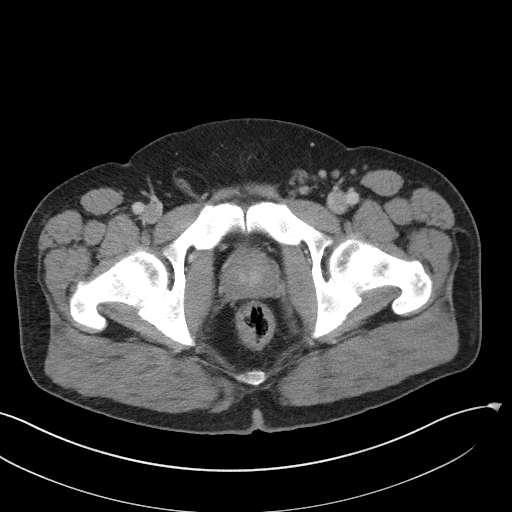
[im 35/64  soft-tissue]
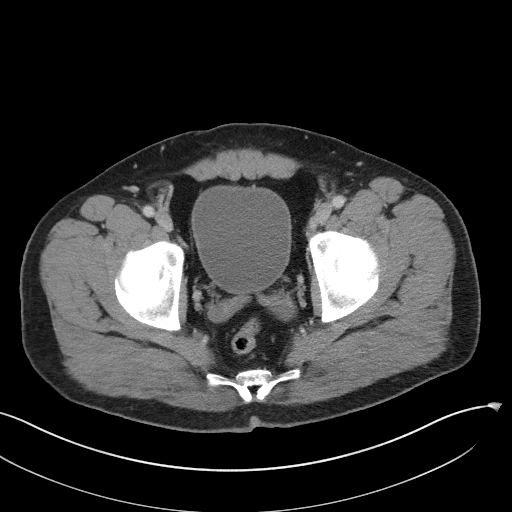
[im 39/64  soft-tissue]
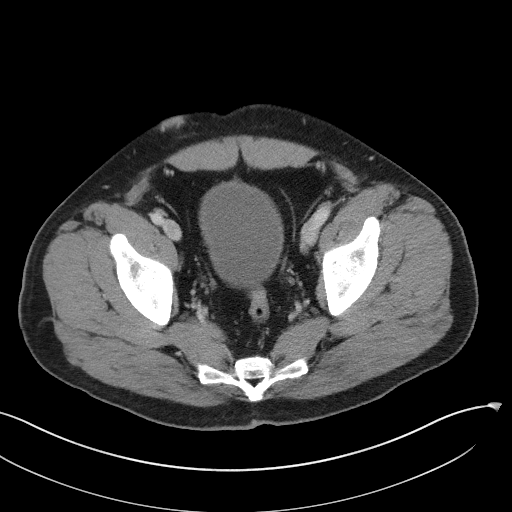
[im 39/64  bone]
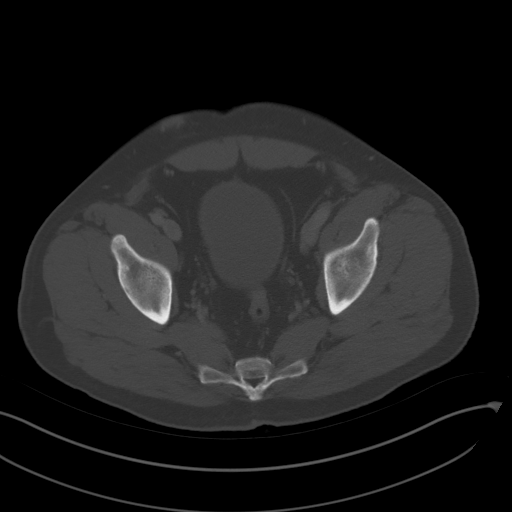
[im 43/64  soft-tissue]
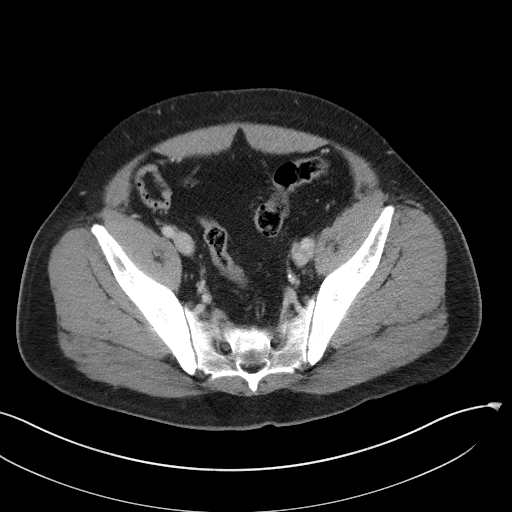
[im 47/64  soft-tissue]
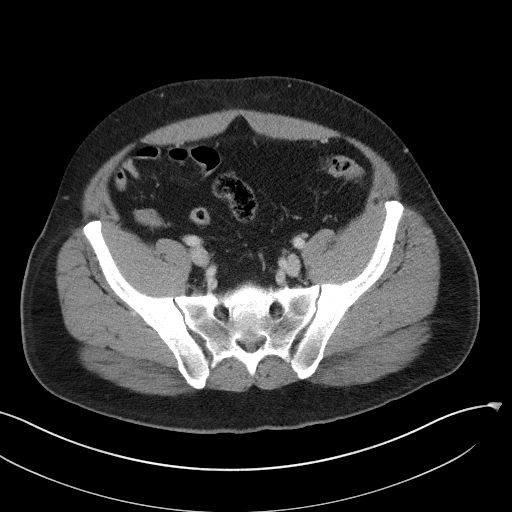
[im 51/64  soft-tissue]
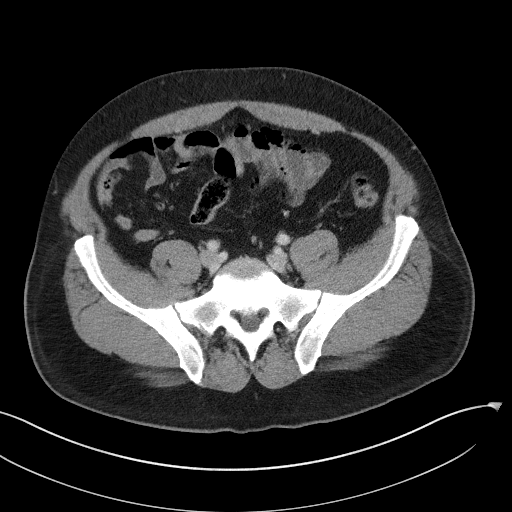
[im 55/64  soft-tissue]
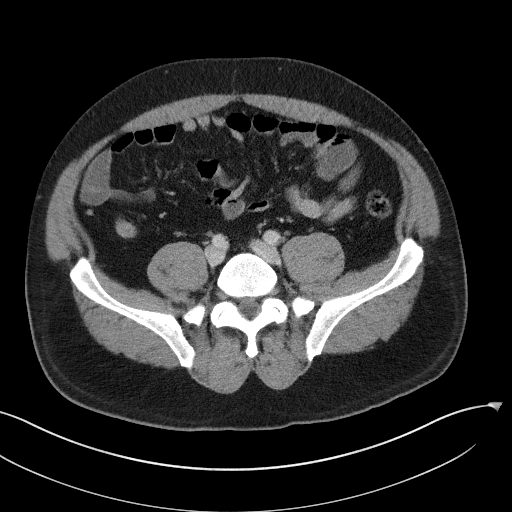
[im 59/64  soft-tissue]
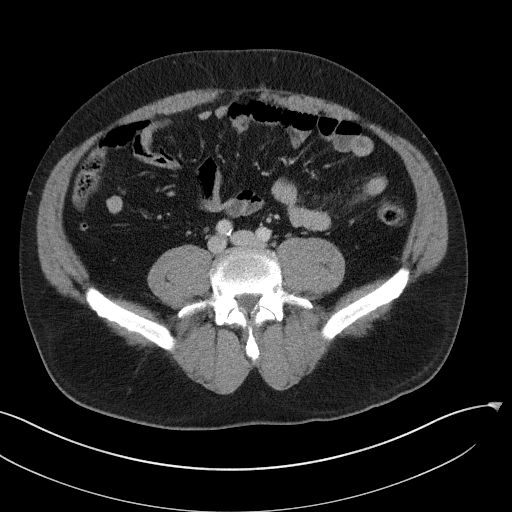

[Series 4: coronal st · coronal · 0.65mm/px · 3 of 100 slices shown]
[im 34/100  soft-tissue]
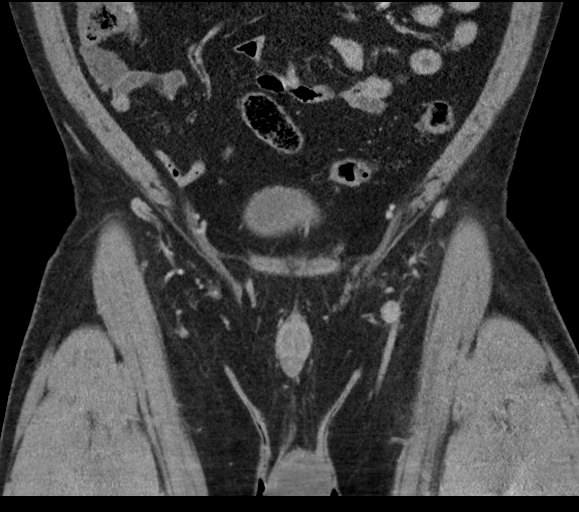
[im 45/100  soft-tissue]
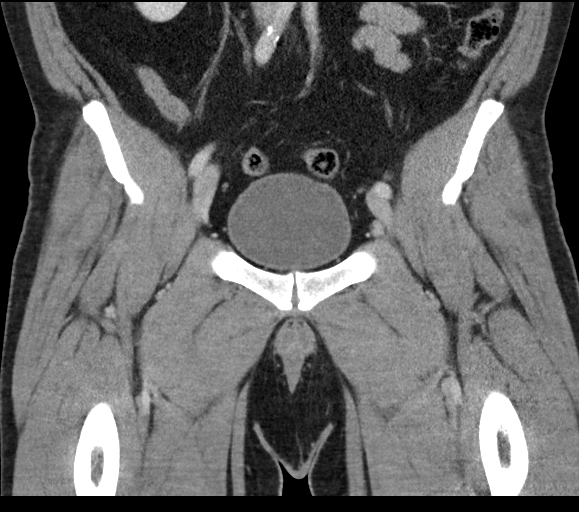
[im 56/100  soft-tissue]
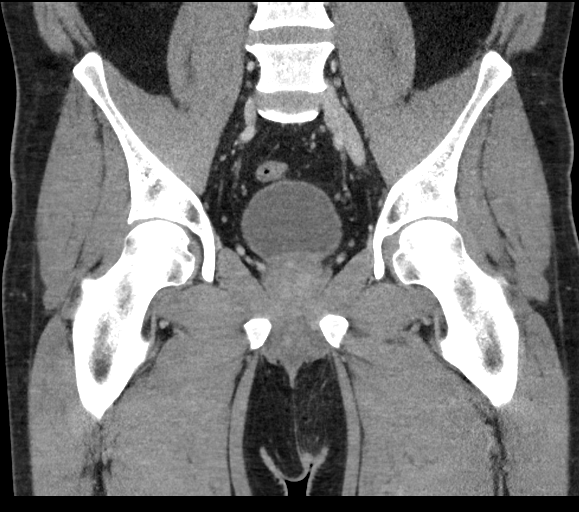

[17 of 46 positions shown; findings below may reference images not displayed]

FINDINGS: Adjacent to the inferior aspect of the rectum slightly to left of
midline is a 4.7 x 4 x 2 cm abscess. There is mild inflammation of
adjacent fat planes of the buttock region greater on the left which
may indicate cellulitis/deep dependent edema. No associated gas
collection as can be seen with necrotizing fasciitis.

No extension into the pelvis.

Mild infiltration of fat planes lower right anterior abdominal wall
may be related to insulin injection although focal cellulitis at
this level not excluded.

Calcified plaque proximal right common iliac artery.

Minimally lobulated prostate gland.

Tiny amount of fluid within the scrotum.
IMPRESSION: Adjacent to the inferior aspect of the rectum slightly to left of
midline is a 4.7 x 4 x 2 cm abscess. There is mild inflammation of
adjacent fat planes of the buttock region greater on the left which
may indicate cellulitis/deep dependent edema. No associated gas
collection as can be seen with necrotizing fasciitis.

No extension into the pelvis.

Mild infiltration of fat planes lower right anterior abdominal wall
may be related to insulin injection although focal cellulitis at
this level not excluded.

## 2018-09-21 NOTE — Telephone Encounter (Signed)
Called pt informed him that we received clearance from his cardiologist to proceed with stopping ASA for prostate bx. Pt gave verbal understanding.

## 2018-09-27 ENCOUNTER — Encounter: Payer: Self-pay | Admitting: Family Medicine

## 2018-09-27 ENCOUNTER — Ambulatory Visit: Payer: BLUE CROSS/BLUE SHIELD | Admitting: Family Medicine

## 2018-09-27 VITALS — BP 120/80 | HR 108 | Temp 97.9°F | Resp 16 | Wt 223.2 lb

## 2018-09-27 DIAGNOSIS — E1165 Type 2 diabetes mellitus with hyperglycemia: Secondary | ICD-10-CM | POA: Diagnosis not present

## 2018-09-27 DIAGNOSIS — Z720 Tobacco use: Secondary | ICD-10-CM | POA: Diagnosis not present

## 2018-09-27 DIAGNOSIS — R300 Dysuria: Secondary | ICD-10-CM | POA: Diagnosis not present

## 2018-09-27 LAB — POCT GLYCOSYLATED HEMOGLOBIN (HGB A1C)
Est. average glucose Bld gHb Est-mCnc: 209
HEMOGLOBIN A1C: 8.9 % — AB (ref 4.0–5.6)

## 2018-09-27 LAB — POCT URINALYSIS DIPSTICK
APPEARANCE: NORMAL
BILIRUBIN UA: NEGATIVE
GLUCOSE UA: POSITIVE — AB
Ketones, UA: NEGATIVE
LEUKOCYTES UA: NEGATIVE
Nitrite, UA: NEGATIVE
Odor: NEGATIVE
Protein, UA: NEGATIVE
RBC UA: NEGATIVE
Spec Grav, UA: 1.01 (ref 1.010–1.025)
Urobilinogen, UA: 0.2 E.U./dL
pH, UA: 6 (ref 5.0–8.0)

## 2018-09-27 LAB — POCT UA - MICROALBUMIN: Microalbumin Ur, POC: NEGATIVE mg/L

## 2018-09-27 MED ORDER — DULAGLUTIDE 1.5 MG/0.5ML ~~LOC~~ SOAJ: 1.5000 mg | SUBCUTANEOUS | 5 refills | Status: AC

## 2018-09-27 NOTE — Progress Notes (Signed)
Patient: [facility]. Male    DOB: 1971/03/17   47 y.o.   MRN: [id number] Visit Date: 09/27/2018  Today's Provider: Lavon Paganini, MD   Chief Complaint  Patient presents with  . Follow-up    T2DM and Tobacco   Subjective:    HPI  Diabetes Mellitus Type II, Follow-up:   RecentLabs       Lab Results  Component Value Date   HGBA1C 9.6 03/29/2018   HGBA1C 6.9 09/28/2017   HGBA1C 8.3 05/04/2017     Last seen for diabetes 3 months ago.  Management since then includes increase Jardiance to 25 mg and continue Metformin. He reports good compliance with treatment. He is not having side effects.  Current symptoms include burning a little when he urinates, reports is from the [place]. Home blood sugar records: have not been checking.  Episodes of hypoglycemia? no              Current Insulin Regimen: none Most Recent Eye Exam: not up to date. Weight trend: increasing steadily. Current diet: in general "unhealthy" Current exercise: walking some  Pertinent Labs: Lab Results  Component Value Date   CHOL 170 05/14/2018   HDL 27 (L) 05/14/2018   LDLCALC 67 05/14/2018   TRIG 381 (H) 05/14/2018   CHOLHDL 6.3 (H) 05/14/2018    Wt Readings from Last 3 Encounters:  09/27/18 223 lb 3.2 oz (101.2 kg)  08/13/18 220 lb 1.6 oz (99.8 kg)  08/02/18 216 lb 3.2 oz (98.1 kg)    ------------------------------------------------------------------------   Follow up for Tobacco Abuse  The patient was last seen for this 3 months ago. Changes made at last visit include increasing Wellbutrin to 300 mg daily.  He reports good compliance with treatment. He feels that condition is Improved.   He is not having side effects.   Is smoking 0.5 PPD, which has decreased from 1 PPD.   He smokes due to people around him smoking He believes that he will quit entirely when he is ready He has quit cold [place] in the past He doesn't think he will smoke more if he stops  taking Wellbutrin and would like to try to get off of this.   No Known Allergies   Current Outpatient Medications:  .  buPROPion (WELLBUTRIN XL) 300 MG 24 hr tablet, Take 1 tablet (300 mg total) by mouth daily., Disp: 30 tablet, Rfl: 5 .  empagliflozin (JARDIANCE) 25 MG TABS tablet, Take 25 mg by mouth daily., Disp: 30 tablet, Rfl: 5 .  metFORMIN (GLUCOPHAGE-XR) 500 MG 24 hr tablet, TAKE 2 TABLETS BY MOUTH DAILY WITH DINNER, Disp: 180 tablet, Rfl: 11 .  omeprazole (PRILOSEC) 20 MG capsule, Take 1 capsule (20 mg total) by mouth 2 (two) times daily before a meal., Disp: 60 capsule, Rfl: 1 .  polyethylene glycol powder (GLYCOLAX/MIRALAX) powder, Take 17 g by mouth 2 (two) times daily., Disp: 3350 g, Rfl: 3 .  rosuvastatin (CRESTOR) 40 MG tablet, Take 1 tablet (40 mg total) by mouth daily., Disp: 30 tablet, Rfl: 11 .  sildenafil (REVATIO) 20 MG tablet, Take 3 to 5 tablets two hours before intercouse on an empty stomach.  Do not take with nitrates., Disp: 50 tablet, Rfl: 3 .  aspirin 81 MG chewable tablet, Chew 81 mg by mouth daily., Disp: , Rfl:  .  CINNAMON PO, Take by mouth., Disp: , Rfl:  .  clomiPHENE (CLOMID) 50 MG tablet, Take 1/2 tablet daily (Patient not  taking: Reported on 09/27/2018), Disp: 30 tablet, Rfl: 3 .  nystatin cream (MYCOSTATIN), Apply 1 application topically 2 (two) times daily. (Patient not taking: Reported on 09/27/2018), Disp: 30 g, Rfl: 0  Review of Systems  Constitutional: Negative.   Eyes: Negative for visual disturbance.  Respiratory: Negative.   Cardiovascular: Negative.   Endocrine: Positive for polydipsia ("stable"). Polyuria: "stable"per patient.  Musculoskeletal: Negative.     Social History   Tobacco Use  . Smoking status: Current Every Day Smoker    Packs/day: 1.00    Years: 15.00    Pack years: 15.00    Types: Cigarettes  . Smokeless tobacco: Never Used  . Tobacco comment: started smoking at age 38; has quit for 15 years but is currently smoking.  Has decreased cigarette use from 1 PPD to 0.5 PPD since starting Wellbutrin (06/27/2018)  Substance Use Topics  . Alcohol use: Yes    Comment: Rarely   Objective:   BP 120/80 (BP Location: Left Arm, Patient Position: Sitting, Cuff Size: Normal)   Pulse (!) 108   Temp 97.9 F (36.6 C) (Oral)   Resp 16   Wt 223 lb 3.2 oz (101.2 kg)   BMI 32.96 kg/m  Vitals:   09/27/18 1051  BP: 120/80  Pulse: (!) 108  Resp: 16  Temp: 97.9 F (36.6 C)  TempSrc: Oral  Weight: 223 lb 3.2 oz (101.2 kg)     Physical Exam  Constitutional: He is oriented to person, place, and time. He appears well-developed and well-nourished. No distress.  HENT:  Head: Normocephalic and atraumatic.  Mouth/Throat: Oropharynx is clear and moist.  Eyes: Conjunctivae are normal. No scleral icterus.  Neck: Neck supple. No thyromegaly present.  Cardiovascular: Normal rate, regular rhythm, normal heart sounds and intact distal pulses.  No murmur heard. Pulmonary/Chest: Effort normal and breath sounds normal. No respiratory distress. He has no wheezes. He has no rales.  Abdominal: Soft. He exhibits no distension. There is no tenderness.  Musculoskeletal: He exhibits no edema.  Lymphadenopathy:    He has no cervical adenopathy.  Neurological: He is alert and oriented to person, place, and time.  Skin: Skin is warm and dry. Capillary refill takes less than 2 seconds. No rash noted.  Psychiatric: He has a normal mood and affect. His behavior is normal.  Vitals reviewed.   Results for orders placed or performed in visit on 09/27/18  POCT glycosylated hemoglobin (Hb A1C)  Result Value Ref Range   Hemoglobin A1C 8.9 (A) 4.0 - 5.6 %   Est. average glucose Bld gHb Est-mCnc 209   POCT UA - Microalbumin  Result Value Ref Range   Microalbumin Ur, POC Negative mg/L  POCT urinalysis dipstick  Result Value Ref Range   Color, UA Light Yellow    Clarity, UA Clear    Glucose, UA Positive (A) Negative   Bilirubin, UA  Negative    Ketones, UA Negative    Spec Grav, UA 1.010 1.010 - 1.025   Blood, UA Negative    pH, UA 6.0 5.0 - 8.0   Protein, UA Negative Negative   Urobilinogen, UA 0.2 0.2 or 1.0 E.U./dL   Nitrite, UA Negative    Leukocytes, UA Negative Negative   Appearance Normal    Odor Negative        Assessment & Plan:   Problem List Items Addressed This Visit      Endocrine   Diabetes mellitus without complication ([place]) - Primary    Uncontrolled with A1c of  8.9 today He is not tolerating Jardiance due to yeast infections We will DC Jardiance Continue metformin at current dose Would like to consider increasing dose again at next visit He was advised again to reschedule his ophthalmology appointment Microalbumin was repeated today Foot exam completed today Up-to-date on vaccinations Start Trulicity-first dose of 0.75 mg given in office today and given samples first first month.  Then he will start 1.5 mg weekly after the first 2 weeks Follow-up in 3 months and recheck A1c      Relevant Medications   Dulaglutide (TRULICITY) 1.5 IL/5.7VJ SOPN     Other   Tobacco abuse    Stable He has decreased his smoking We will discontinue Wellbutrin as he would like to do this Follow-up in 3 months with the hope that he will have quit smoking at that time       Other Visit Diagnoses    Dysuria       Relevant Orders   POCT urinalysis dipstick (Completed)    -No signs of UTI -Likely related to yeast infections which she has been treated for by urology -She can continue nystatin cream as needed -Since he has had recurrent yeast infections, we will discontinue his Jardiance as above   Return in about 3 months (around 12/28/2018) for CPE.   The entirety of the information documented in the History of Present Illness, Review of Systems and Physical Exam were personally obtained by me. Portions of this information were initially documented by Tiburcio Pea and Lyndel Pleasure, CMA and reviewed  by me for thoroughness and accuracy.    Virginia Crews, MD, MPH [facility] 09/27/2018 4:56 PM

## 2018-09-27 NOTE — Patient Instructions (Signed)
Diet Recommendations for Diabetes   Starchy (carb) foods include: Bread, rice, pasta, potatoes, corn, crackers, bagels, muffins, all baked goods.  (Fruits, milk, and yogurt also have carbohydrate, but most of these foods will not spike your blood sugar as the starchy foods will.)  A few fruits do cause high blood sugars; use small portions of bananas (limit to 1/2 at a time), grapes, watermelon, and most tropical fruits.    Protein foods include: Meat, fish, poultry, eggs, dairy foods, and beans such as pinto and kidney beans (beans also provide carbohydrate).   1. Eat at least 3 meals and 1-2 snacks per day. Never go more than 4-5 hours while awake without eating. Eat breakfast within the first hour of getting up.   2. Limit starchy foods to TWO per meal and ONE per snack. ONE portion of a starchy  food is equal to the following:   - ONE slice of bread (or its equivalent, such as half of a hamburger bun).   - 1/2 cup of a "scoopable" starchy food such as potatoes or rice.   - 15 grams of carbohydrate as shown on food label.  3. Include at every meal: a protein food, a carb food, and vegetables and/or fruit.   - Obtain twice as many veg's as protein or carbohydrate foods for both lunch and dinner.   - Fresh or frozen veg's are best.   - Try to keep frozen veg's on hand for a quick vegetable serving.        Dulaglutide injection What is this medicine? DULAGLUTIDE (DOO la GLOO tide) is used to improve blood sugar control in adults with type 2 diabetes. This medicine may be used with other oral diabetes medicines. This medicine may be used for other purposes; ask your health care provider or pharmacist if you have questions. COMMON BRAND NAME(S): TRULICITY What should I tell my health care provider before I take this medicine? They need to know if you have any of these conditions: -endocrine tumors (MEN 2) or if someone in your family had these tumors -history of pancreatitis -kidney  disease -liver disease -stomach problems -thyroid cancer or if someone in your family had thyroid cancer -an unusual or allergic reaction to dulaglutide, other medicines, foods, dyes, or preservatives -pregnant or trying to get pregnant -breast-feeding How should I use this medicine? This medicine is for injection under the skin of your upper leg (thigh), stomach area, or upper arm. It is usually given once every week (every 7 days). You will be taught how to prepare and give this medicine. Use exactly as directed. Take your medicine at regular intervals. Do not take it more often than directed. If you use this medicine with insulin, you should inject this medicine and the insulin separately. Do not mix them together. Do not give the injections right next to each other. Change (rotate) injection sites with each injection. It is important that you put your used needles and syringes in a special sharps container. Do not put them in a trash can. If you do not have a sharps container, call your pharmacist or healthcare provider to get one. A special MedGuide will be given to you by the pharmacist with each prescription and refill. Be sure to read this information carefully each time. Talk to your pediatrician regarding the use of this medicine in children. Special care may be needed. Overdosage: If you think you have taken too much of this medicine contact a poison control center or emergency  room at once. NOTE: This medicine is only for you. Do not share this medicine with others. What if I miss a dose? If you miss a dose, take it as soon as you can within 3 days after the missed dose. Then take your next dose at your regular weekly time. If it has been longer than 3 days after the missed dose, do not take the missed dose. Take the next dose at your regular time. Do not take double or extra doses. If you have questions about a missed dose, contact your health care provider for advice. What may interact  with this medicine? -other medicines for diabetes Many medications may cause changes in blood sugar, these include: -alcohol containing beverages -antiviral medicines for HIV or AIDS -aspirin and aspirin-like drugs -certain medicines for blood pressure, heart disease, irregular heart beat -chromium -diuretics -male hormones, such as estrogens or progestins, birth control pills -fenofibrate -gemfibrozil -isoniazid -lanreotide -male hormones or anabolic steroids -MAOIs like Carbex, Eldepryl, Marplan, Nardil, and Parnate -medicines for weight loss -medicines for allergies, asthma, cold, or cough -medicines for depression, anxiety, or psychotic disturbances -niacin -nicotine -NSAIDs, medicines for pain and inflammation, like ibuprofen or naproxen -octreotide -pasireotide -pentamidine -phenytoin -probenecid -quinolone antibiotics such as ciprofloxacin, levofloxacin, ofloxacin -some herbal dietary supplements -steroid medicines such as prednisone or cortisone -sulfamethoxazole; trimethoprim -thyroid hormones Some medications can hide the warning symptoms of low blood sugar (hypoglycemia). You may need to monitor your blood sugar more closely if you are taking one of these medications. These include: -beta-blockers, often used for high blood pressure or heart problems (examples include atenolol, metoprolol, propranolol) -clonidine -guanethidine -reserpine This list may not describe all possible interactions. Give your health care provider a list of all the medicines, herbs, non-prescription drugs, or dietary supplements you use. Also tell them if you smoke, drink alcohol, or use illegal drugs. Some items may interact with your medicine. What should I watch for while using this medicine? Visit your doctor or health care professional for regular checks on your progress. Drink plenty of fluids while taking this medicine. Check with your doctor or health care professional if you get an  attack of severe diarrhea, nausea, and vomiting. The loss of too much body fluid can make it dangerous for you to take this medicine. A test called the HbA1C (A1C) will be monitored. This is a simple blood test. It measures your blood sugar control over the last 2 to 3 months. You will receive this test every 3 to 6 months. Learn how to check your blood sugar. Learn the symptoms of low and high blood sugar and how to manage them. Always carry a quick-source of sugar with you in case you have symptoms of low blood sugar. Examples include hard sugar candy or glucose tablets. Make sure others know that you can choke if you eat or drink when you develop serious symptoms of low blood sugar, such as seizures or unconsciousness. They must get medical help at once. Tell your doctor or health care professional if you have high blood sugar. You might need to change the dose of your medicine. If you are sick or exercising more than usual, you might need to change the dose of your medicine. Do not skip meals. Ask your doctor or health care professional if you should avoid alcohol. Many nonprescription cough and cold products contain sugar or alcohol. These can affect blood sugar. Pens should never be shared. Even if the needle is changed, sharing may result in passing  of viruses like hepatitis or HIV. Wear a medical ID bracelet or chain, and carry a card that describes your disease and details of your medicine and dosage times. What side effects may I notice from receiving this medicine? Side effects that you should report to your doctor or health care professional as soon as possible: -allergic reactions like skin rash, itching or hives, swelling of the face, lips, or tongue -breathing problems -diarrhea that continues or is severe -lump or swelling on the neck -severe nausea -signs and symptoms of infection like fever or chills; cough; sore throat; pain or trouble passing urine -signs and symptoms of low blood  sugar such as feeling anxious, confusion, dizziness, increased hunger, unusually weak or tired, sweating, shakiness, cold, irritable, headache, blurred vision, fast heartbeat, loss of consciousness -signs and symptoms of kidney injury like trouble passing urine or change in the amount of urine -trouble swallowing -unusual stomach upset or pain -vomiting Side effects that usually do not require medical attention (report to your doctor or health care professional if they continue or are bothersome): -diarrhea -loss of appetite -nausea -pain, redness, or irritation at site where injected -stomach upset This list may not describe all possible side effects. Call your doctor for medical advice about side effects. You may report side effects to FDA at 1-800-FDA-1088. Where should I keep my medicine? Keep out of the reach of children. Store unopened pens in a refrigerator between 2 and 8 degrees C (36 and 46 degrees F). Do not freeze or use if the medicine has been frozen. Protect from light and excessive heat. Store in the carton until use. Each single-dose pen can be kept at room temperature, not to exceed 30 degrees C (86 degrees F) for a total of 14 days, if needed. Throw away any unused medicine after the expiration date on the label. NOTE: This sheet is a summary. It may not cover all possible information. If you have questions about this medicine, talk to your doctor, pharmacist, or health care provider.  2018 [employer]vier/[employer] Standard (2016-12-08 14:35:01)

## 2018-09-27 NOTE — Assessment & Plan Note (Signed)
Stable He has decreased his smoking We will discontinue Wellbutrin as he would like to do this Follow-up in 3 months with the hope that he will have quit smoking at that time

## 2018-09-27 NOTE — Assessment & Plan Note (Signed)
Uncontrolled with A1c of 8.9 today He is not tolerating Jardiance due to yeast infections We will DC Jardiance Continue metformin at current dose Would like to consider increasing dose again at next visit He was advised again to reschedule his ophthalmology appointment Microalbumin was repeated today Foot exam completed today Up-to-date on vaccinations Start Trulicity-first dose of 0.75 mg given in office today and given samples first first month.  Then he will start 1.5 mg weekly after the first 2 weeks Follow-up in 3 months and recheck A1c

## 2018-09-30 ENCOUNTER — Other Ambulatory Visit: Payer: Self-pay | Admitting: Gastroenterology

## 2018-10-02 ENCOUNTER — Telehealth: Payer: Self-pay | Admitting: Family Medicine

## 2018-10-02 NOTE — Telephone Encounter (Signed)
Dulaglutide (TRULICITY) 1.5 MW/1.0UV SOPN  Ppt is needing this medication because he left the free samples ones Dr. B gave him at his girlfriends house and cannot go back to get them.   He is now in [facility].  Please advise.  Thanks, [employer]

## 2018-10-03 NOTE — Telephone Encounter (Signed)
Rx was already sent to the pharmacy at last visit.  Should be able to call and have them fill it  Brita Romp, Dionne Bucy, MD, MPH [facility] 10/03/2018 8:22 AM

## 2018-10-03 NOTE — Telephone Encounter (Signed)
Left patient a voicemail advising him that RX was sent to local [facility] on 10/24 and call them to transfer to his new pharmacy.

## 2018-10-03 NOTE — Telephone Encounter (Signed)
OK. Pharmacy can transfer the medication to a pharmacy near him  Virginia Crews, MD, MPH [facility] 10/03/2018 9:14 AM

## 2018-10-04 ENCOUNTER — Other Ambulatory Visit: Payer: Self-pay | Admitting: Urology

## 2018-10-08 ENCOUNTER — Ambulatory Visit: Payer: BLUE CROSS/BLUE SHIELD | Admitting: Gastroenterology

## 2018-10-15 ENCOUNTER — Ambulatory Visit: Payer: BLUE CROSS/BLUE SHIELD | Admitting: Gastroenterology

## 2018-10-24 ENCOUNTER — Ambulatory Visit: Payer: Self-pay | Admitting: Urology

## 2018-11-16 ENCOUNTER — Other Ambulatory Visit: Payer: Self-pay | Admitting: Family Medicine

## 2018-11-16 MED ORDER — OMEPRAZOLE 20 MG PO CPDR: 20.0000 mg | DELAYED_RELEASE_CAPSULE | Freq: Two times a day (BID) | ORAL | 1 refills | Status: DC

## 2018-11-16 NOTE — Telephone Encounter (Signed)
Pt requesting refill of omeprazole (PRILOSEC) 20 MG capsule [facility] to [facility]

## 2018-12-07 ENCOUNTER — Other Ambulatory Visit: Payer: Self-pay | Admitting: Urology

## 2018-12-21 ENCOUNTER — Ambulatory Visit: Payer: Self-pay | Admitting: Urology

## 2018-12-31 ENCOUNTER — Encounter: Payer: Self-pay | Admitting: Family Medicine

## 2019-02-08 ENCOUNTER — Other Ambulatory Visit: Payer: Self-pay | Admitting: Urology

## 2019-02-08 ENCOUNTER — Telehealth: Payer: Self-pay | Admitting: Urology

## 2019-02-08 NOTE — Telephone Encounter (Signed)
We can refer him to the [facility] located on [address] in [place].  Their office number is [phone].  He can contact them and schedule an appointment.  As he lives two hours away, he can sign the medical release form at that office and we will be happy to send his records to them.

## 2019-02-08 NOTE — Telephone Encounter (Signed)
If you refer him he doesn't need to sign a release I can just send his records. I can just send it if that's what he wants and you want me to do.   Sharyn Lull

## 2019-02-08 NOTE — Telephone Encounter (Signed)
Called pt to reschedule biopsy that was canceled via phone tree, pt states he can't reschedule due to relocating to [place] away. [facility].  Pt asks is it possible to refer him to a Urologist closer to him.   Please advise. Thank you.

## 2019-02-11 NOTE — Telephone Encounter (Signed)
I am not familiar with the urologists in that area.  If Ethan Fox would either call or look at the website to decide which urologist he would like to see, I can then make a referral and then we can fax the records to them.

## 2019-02-13 NOTE — Telephone Encounter (Signed)
I have sent the referral to [facility] per patient's request. He has been notified of this. Faxed to [phone] Phone [phone]   [facility]

## 2019-02-13 NOTE — Telephone Encounter (Signed)
I will call him.

## 2019-02-22 ENCOUNTER — Ambulatory Visit: Payer: Self-pay | Admitting: Urology

## 2019-02-25 ENCOUNTER — Other Ambulatory Visit: Payer: Self-pay | Admitting: Family Medicine

## 2019-03-07 DIAGNOSIS — R351 Nocturia: Secondary | ICD-10-CM | POA: Diagnosis not present

## 2019-03-07 DIAGNOSIS — N401 Enlarged prostate with lower urinary tract symptoms: Secondary | ICD-10-CM | POA: Diagnosis not present

## 2019-03-07 DIAGNOSIS — R972 Elevated prostate specific antigen [PSA]: Secondary | ICD-10-CM | POA: Diagnosis not present

## 2019-03-07 DIAGNOSIS — Z125 Encounter for screening for malignant neoplasm of prostate: Secondary | ICD-10-CM | POA: Diagnosis not present

## 2019-03-07 DIAGNOSIS — E291 Testicular hypofunction: Secondary | ICD-10-CM | POA: Diagnosis not present

## 2019-03-29 ENCOUNTER — Other Ambulatory Visit: Payer: Self-pay | Admitting: Family Medicine

## 2019-04-01 NOTE — Telephone Encounter (Signed)
Patient is overdue for diabetes/chronic disease f/u.  Please set up evisit with drive up [facility] and BP check. Thanks!

## 2019-04-01 NOTE — Telephone Encounter (Signed)
Patient states he now lives in [place]. He is looking for a PCP there.

## 2019-04-27 ENCOUNTER — Other Ambulatory Visit: Payer: Self-pay | Admitting: Family Medicine

## 2019-05-01 NOTE — Telephone Encounter (Signed)
Patient moved and has different PCP now

## 2019-07-17 ENCOUNTER — Other Ambulatory Visit: Payer: Self-pay | Admitting: Family Medicine

## 2019-07-17 NOTE — Telephone Encounter (Signed)
Believe patient has moved and is seeing a new PCP

## 2019-08-07 DIAGNOSIS — K219 Gastro-esophageal reflux disease without esophagitis: Secondary | ICD-10-CM | POA: Diagnosis not present

## 2019-08-07 DIAGNOSIS — Z7689 Persons encountering health services in other specified circumstances: Secondary | ICD-10-CM | POA: Diagnosis not present

## 2019-08-07 DIAGNOSIS — E1165 Type 2 diabetes mellitus with hyperglycemia: Secondary | ICD-10-CM | POA: Diagnosis not present

## 2019-08-07 DIAGNOSIS — E785 Hyperlipidemia, unspecified: Secondary | ICD-10-CM | POA: Diagnosis not present

## 2019-08-07 DIAGNOSIS — N401 Enlarged prostate with lower urinary tract symptoms: Secondary | ICD-10-CM | POA: Diagnosis not present

## 2019-09-05 ENCOUNTER — Other Ambulatory Visit: Payer: Self-pay | Admitting: Family Medicine

## 2019-09-05 NOTE — Telephone Encounter (Signed)
Spoke with pt and he has moved to [place]  and is needed a refill on his medication until he find a doctor. Please advise

## 2019-09-06 ENCOUNTER — Other Ambulatory Visit: Payer: Self-pay | Admitting: Family Medicine

## 2019-09-09 DIAGNOSIS — N401 Enlarged prostate with lower urinary tract symptoms: Secondary | ICD-10-CM | POA: Diagnosis not present

## 2019-09-09 DIAGNOSIS — R351 Nocturia: Secondary | ICD-10-CM | POA: Diagnosis not present

## 2019-09-09 DIAGNOSIS — Z125 Encounter for screening for malignant neoplasm of prostate: Secondary | ICD-10-CM | POA: Diagnosis not present

## 2019-11-06 ENCOUNTER — Other Ambulatory Visit: Payer: Self-pay | Admitting: Urology

## 2019-11-08 DIAGNOSIS — Z87891 Personal history of nicotine dependence: Secondary | ICD-10-CM | POA: Diagnosis not present

## 2019-11-08 DIAGNOSIS — R0602 Shortness of breath: Secondary | ICD-10-CM | POA: Diagnosis not present

## 2019-11-08 DIAGNOSIS — E1165 Type 2 diabetes mellitus with hyperglycemia: Secondary | ICD-10-CM | POA: Diagnosis not present

## 2019-11-08 DIAGNOSIS — E669 Obesity, unspecified: Secondary | ICD-10-CM | POA: Diagnosis not present

## 2020-03-15 ENCOUNTER — Other Ambulatory Visit: Payer: Self-pay | Admitting: Family Medicine
